# Patient Record
Sex: Female | Born: 1995 | Race: Black or African American | Marital: Single | Smoking: Never smoker
Health system: Southern US, Community
[De-identification: ages and names within clinical notes are randomized; demographics above are authoritative.]

## PROBLEM LIST (undated history)

## (undated) ENCOUNTER — Inpatient Hospital Stay (HOSPITAL_COMMUNITY): Payer: Self-pay

## (undated) DIAGNOSIS — F329 Major depressive disorder, single episode, unspecified: Secondary | ICD-10-CM

## (undated) DIAGNOSIS — Z789 Other specified health status: Secondary | ICD-10-CM

## (undated) DIAGNOSIS — F419 Anxiety disorder, unspecified: Secondary | ICD-10-CM

## (undated) DIAGNOSIS — F32A Depression, unspecified: Secondary | ICD-10-CM

## (undated) HISTORY — DX: Other specified health status: Z78.9

## (undated) HISTORY — PX: NO PAST SURGERIES: SHX2092

---
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
## 2017-11-17 LAB — OB RESULTS CONSOLE ABO/RH: RH Type: NEGATIVE

## 2017-11-17 LAB — OB RESULTS CONSOLE GC/CHLAMYDIA
Chlamydia: NEGATIVE
Gonorrhea: NEGATIVE

## 2017-11-17 LAB — OB RESULTS CONSOLE ANTIBODY SCREEN: ANTIBODY SCREEN: NEGATIVE

## 2017-11-17 LAB — OB RESULTS CONSOLE HIV ANTIBODY (ROUTINE TESTING): HIV: NONREACTIVE

## 2017-11-17 LAB — OB RESULTS CONSOLE RUBELLA ANTIBODY, IGM: Rubella: IMMUNE

## 2017-11-17 LAB — OB RESULTS CONSOLE RPR: RPR: NONREACTIVE

## 2017-11-17 LAB — OB RESULTS CONSOLE HEPATITIS B SURFACE ANTIGEN: Hepatitis B Surface Ag: NEGATIVE

## 2017-11-17 LAB — SICKLE CELL SCREEN: SICKLE CELL SCREEN: NEGATIVE

## 2018-05-23 ENCOUNTER — Encounter: Payer: Self-pay | Admitting: *Deleted

## 2018-05-23 DIAGNOSIS — Z3403 Encounter for supervision of normal first pregnancy, third trimester: Secondary | ICD-10-CM

## 2018-05-23 DIAGNOSIS — Z3400 Encounter for supervision of normal first pregnancy, unspecified trimester: Secondary | ICD-10-CM | POA: Insufficient documentation

## 2018-05-24 ENCOUNTER — Encounter: Payer: Self-pay | Admitting: Advanced Practice Midwife

## 2018-05-24 ENCOUNTER — Ambulatory Visit: Payer: Self-pay | Admitting: *Deleted

## 2018-06-02 ENCOUNTER — Encounter: Payer: Self-pay | Admitting: Advanced Practice Midwife

## 2018-06-02 ENCOUNTER — Encounter (INDEPENDENT_AMBULATORY_CARE_PROVIDER_SITE_OTHER): Payer: Self-pay

## 2018-06-02 ENCOUNTER — Ambulatory Visit: Payer: Medicaid Other | Admitting: *Deleted

## 2018-06-02 ENCOUNTER — Ambulatory Visit (INDEPENDENT_AMBULATORY_CARE_PROVIDER_SITE_OTHER): Payer: Medicaid Other | Admitting: Advanced Practice Midwife

## 2018-06-02 VITALS — BP 121/78 | HR 98 | Ht <= 58 in | Wt 168.0 lb

## 2018-06-02 DIAGNOSIS — O26893 Other specified pregnancy related conditions, third trimester: Secondary | ICD-10-CM

## 2018-06-02 DIAGNOSIS — Z1389 Encounter for screening for other disorder: Secondary | ICD-10-CM | POA: Diagnosis not present

## 2018-06-02 DIAGNOSIS — O360130 Maternal care for anti-D [Rh] antibodies, third trimester, not applicable or unspecified: Secondary | ICD-10-CM | POA: Diagnosis not present

## 2018-06-02 DIAGNOSIS — O26899 Other specified pregnancy related conditions, unspecified trimester: Secondary | ICD-10-CM

## 2018-06-02 DIAGNOSIS — Z3403 Encounter for supervision of normal first pregnancy, third trimester: Secondary | ICD-10-CM

## 2018-06-02 DIAGNOSIS — R87612 Low grade squamous intraepithelial lesion on cytologic smear of cervix (LGSIL): Secondary | ICD-10-CM | POA: Diagnosis not present

## 2018-06-02 DIAGNOSIS — Z331 Pregnant state, incidental: Secondary | ICD-10-CM | POA: Diagnosis not present

## 2018-06-02 DIAGNOSIS — Z6791 Unspecified blood type, Rh negative: Secondary | ICD-10-CM | POA: Insufficient documentation

## 2018-06-02 LAB — POCT URINALYSIS DIPSTICK OB
Blood, UA: NEGATIVE
Glucose, UA: NEGATIVE
KETONES UA: NEGATIVE
Leukocytes, UA: NEGATIVE
NITRITE UA: NEGATIVE
PROTEIN: NEGATIVE

## 2018-06-02 NOTE — Progress Notes (Signed)
LOW-RISK PREGNANCY VISIT Patient name: Paige Compton MRN [id number]  Date of birth: 1996/04/16 Chief Complaint:   Initial Prenatal Visit (transfer @ 36wk, headaches, blurred vision)  PNC at [facility], wants to deliver at [facility] available, uneventful pregnancy thus far.  Missed a 34 week appt d/t transfer, otherwise good care. 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 Will need pp colpo.   History of Present Illness:   Paige Compton is a 22 y.o. G1P0 female at [redacted]w[redacted]d with an Estimated Date of Delivery: 06/27/18 being seen today for ongoing management of a low-risk pregnancy.  Today she reports having episodes of HA and some blurred vision. .on Saturday.Went to hospital, no abnormalities found'  Contractions: Not present. Vag. Bleeding: None.  Movement: Present. denies leaking of fluid. Review of Systems:   Pertinent items are noted in HPI Denies abnormal vaginal discharge w/ itching/odor/irritation, headaches, visual changes, shortness of breath, chest pain, abdominal pain, severe nausea/vomiting, or problems with urination or bowel movements unless otherwise stated above.  Pertinent History Reviewed:  Medical & Surgical Hx:   Past Medical History:  Diagnosis Date  . Medical history non-contributory    History reviewed. No pertinent surgical history. Family History  Problem Relation Age of Onset  . Cancer Maternal Aunt        breast  . Leukemia Maternal Uncle   . Cancer Paternal Uncle        pancreatic  . Hypertension Maternal Grandmother     Current Outpatient Medications:  .  Prenat w/o A-FeCbGl-DSS-FA-DHA (CITRANATAL ASSURE) 35-1 & 300 MG tablet, Take 1 tablet by mouth daily., Disp: , Rfl:  Social History: Reviewed -  reports that she has never smoked. She has never used smokeless tobacco.  Physical Assessment:   Vitals:   06/02/18 1359 06/02/18 1400  BP: 121/78   Pulse: 98   Weight: 168 lb (76.2 kg)   Height:  4\' 9"  (1.448 m)  Body mass index is 36.35 kg/m.        Physical Examination:     General appearance: Well appearing, and in no distress  Mental status: Alert, oriented to person, place, and time  Skin: Warm & dry  Cardiovascular: Normal heart rate noted  Respiratory: Normal respiratory effort, no distress  Abdomen: Soft, gravid, nontender  Pelvic: Cervical exam deferred       vtx confirmed w/US  Extremities: Edema: Trace  Fetal Status:     Movement: Present    Results for orders placed or performed in visit on 06/02/18 (from the past 24 hour(s))  POC Urinalysis Dipstick OB   Collection Time: 06/02/18  2:22 PM  Result Value Ref Range   Color, UA     Clarity, UA     Glucose, UA Negative Negative   Bilirubin, UA     Ketones, UA neg    Spec Grav, UA     Blood, UA neg    pH, UA     POC,PROTEIN,UA Negative Negative, Trace, Small (1+), Moderate (2+), Large (3+), 4+   Urobilinogen, UA     Nitrite, UA neg    Leukocytes, UA Negative Negative   Appearance     Odor      Assessment & Plan:  1) Low-risk pregnancy G1P0 at [redacted]w[redacted]d with an Estimated Date of Delivery: 06/27/18   2) Transfer from [place],    Plan:  Continue routine obstetrical care    Follow-up: Return in about 1 week (around 06/09/2018) for [place].  Orders Placed This Encounter  Procedures  . POC Urinalysis  Dipstick OB   Christin Fudge CNM 06/02/2018 2:24 PM

## 2018-06-02 NOTE — Patient Instructions (Signed)
For Headaches:   Stay well hydrated, drink enough water so that your urine is clear, sometimes if you are dehydrated you can get headaches  Eat small frequent meals and snacks, sometimes if you are hungry you can get headaches  Sometimes you get headaches during pregnancy from the pregnancy hormones  You can try tylenol (1-2 regular strength 325mg  or 1-2 extra strength 500mg ) as directed on the box. The least amount of medication that works is best.   Cool compresses (cool wet washcloth or ice pack) to area of head that is hurting  You can also try drinking a caffeinated drink to see if this will help  If not helping, try below:  For Prevention of Headaches/Migraines:  CoQ10 100mg  three times daily  Vitamin B2 400mg  daily  Magnesium Oxide 400-600mg  daily  If You Get a Bad Headache/Migraine:  Benadryl 25mg    Magnesium Oxide  1 large Gatorade  2 extra strength Tylenol (1,000mg  total)  1 cup coffee or Coke  If this doesn't help please call us @ [phone]

## 2018-06-09 ENCOUNTER — Ambulatory Visit (INDEPENDENT_AMBULATORY_CARE_PROVIDER_SITE_OTHER): Payer: Medicaid Other | Admitting: Obstetrics and Gynecology

## 2018-06-09 VITALS — Wt 168.0 lb

## 2018-06-09 DIAGNOSIS — Z1389 Encounter for screening for other disorder: Secondary | ICD-10-CM | POA: Diagnosis not present

## 2018-06-09 DIAGNOSIS — Z3A38 38 weeks gestation of pregnancy: Secondary | ICD-10-CM

## 2018-06-09 DIAGNOSIS — Z331 Pregnant state, incidental: Secondary | ICD-10-CM | POA: Diagnosis not present

## 2018-06-09 DIAGNOSIS — Z3403 Encounter for supervision of normal first pregnancy, third trimester: Secondary | ICD-10-CM

## 2018-06-09 LAB — POCT URINALYSIS DIPSTICK OB
GLUCOSE, UA: NEGATIVE
Ketones, UA: NEGATIVE
LEUKOCYTES UA: NEGATIVE
Nitrite, UA: NEGATIVE
POC,PROTEIN,UA: NEGATIVE
RBC UA: NEGATIVE

## 2018-06-09 NOTE — Progress Notes (Signed)
Patient ID: Freida Nebel, female   DOB: 1995-09-06, 22 y.o.   MRN: [id number]    LOW-RISK PREGNANCY VISIT Patient name: Paige Compton MRN [id number]  Date of birth: August 31, 1995 Chief Complaint:   Routine Prenatal Visit  History of Present Illness:   Paige Compton is a 22 y.o. G1P0 female at [redacted]w[redacted]d with an Estimated Date of Delivery: 06/23/18 being seen today for ongoing management of a low-risk pregnancy. Long gap between being seen in pregnancy. Was going to a women's center every 2 week sthen missed appts since 27 wk till last visit here. Cousin is from [place], and pt decided to go to [employer].Transfer from [place] @ 36 weeks. At 06/02/18 head headache with blurry vision. Wants to deliver at [facility]. 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 Will need colpo. Lives in [place] but cousin will be her ride for delivery at [facility]. Has NOT been to [facility]. Today she reports no complaints. Contractions: Not present. Vag. Bleeding: None.  Movement: Present. denies leaking of fluid. Review of Systems:   Pertinent items are noted in HPI Denies abnormal vaginal discharge w/ itching/odor/irritation, headaches, visual changes, shortness of breath, chest pain, abdominal pain, severe nausea/vomiting, or problems with urination or bowel movements unless otherwise stated above. Pertinent History Reviewed:  Reviewed past medical,surgical, social, obstetrical and family history.  Reviewed problem list, medications and allergies. Physical Assessment:   Vitals:   06/09/18 1201  Weight: 168 lb (76.2 kg)  Body mass index is 36.35 kg/m.        Physical Examination:   General appearance: Well appearing, and in no distress  Mental status: Alert, oriented to person, place, and time  Skin: Warm & dry  Cardiovascular: Normal heart rate noted  Respiratory: Normal respiratory effort, no distress  Abdomen: Soft, gravid, nontender  Pelvic: Cervical exam performed closed, vertex        Extremities: Edema: Trace  Fetal Status: Fetal Heart Rate  (bpm): 137 Fundal Height: 36 cm Movement: Present    Results for orders placed or performed in visit on 06/09/18 (from the past 24 hour(s))  POC Urinalysis Dipstick OB   Collection Time: 06/09/18 12:04 PM  Result Value Ref Range   Color, UA     Clarity, UA     Glucose, UA Negative Negative   Bilirubin, UA     Ketones, UA neg    Spec Grav, UA     Blood, UA neg    pH, UA     POC,PROTEIN,UA Negative Negative, Trace, Small (1+), Moderate (2+), Large (3+), 4+   Urobilinogen, UA     Nitrite, UA neg    Leukocytes, UA Negative Negative   Appearance     Odor      Assessment & Plan:  1) Low-risk pregnancy G1P0 at [redacted]w[redacted]d with an Estimated Date of Delivery: 06/23/18   2) Headaches with blurry/strry vision, resolved  3) Constipation advised stool softener.   Meds: No orders of the defined types were placed in this encounter.  Labs/procedures today: GBS GC/CHL  Plan:  1. F/u in 1 week  Reviewed: Term labor symptoms and general obstetric precautions including but not limited to vaginal bleeding, contractions, leaking of fluid and fetal movement were reviewed in detail with the patient.  All questions were answered  Follow-up: No follow-ups on file.  Orders Placed This Encounter  Procedures  . GC/Chlamydia Probe Amp([facility])  . Culture, beta strep (group b only)  . POC Urinalysis Dipstick OB   By signing my name below, I, Samul Dada, attest that this  documentation has been prepared under the direction and in the presence of Jonnie Kind, MD. Electronically Signed: [place]. 06/09/18. 12:26 PM.  I personally performed the services described in this documentation, which was SCRIBED in my presence. The recorded information has been reviewed and considered accurate. It has been edited as necessary during review. Jonnie Kind, MD

## 2018-06-12 LAB — GC/CHLAMYDIA PROBE AMP
Chlamydia trachomatis, NAA: NEGATIVE
Neisseria gonorrhoeae by PCR: NEGATIVE

## 2018-06-13 LAB — CULTURE, BETA STREP (GROUP B ONLY): STREP GP B CULTURE: NEGATIVE

## 2018-06-16 ENCOUNTER — Inpatient Hospital Stay (HOSPITAL_COMMUNITY)
Admission: AD | Admit: 2018-06-16 | Discharge: 2018-06-16 | Disposition: A | Discharge status: Home / Self Care | Payer: Medicaid Other | Source: Ambulatory Visit | Attending: Obstetrics & Gynecology | Admitting: Obstetrics & Gynecology

## 2018-06-16 ENCOUNTER — Encounter (HOSPITAL_COMMUNITY): Payer: Self-pay | Admitting: *Deleted

## 2018-06-16 ENCOUNTER — Encounter: Payer: Medicaid Other | Admitting: Advanced Practice Midwife

## 2018-06-16 ENCOUNTER — Other Ambulatory Visit: Payer: Self-pay

## 2018-06-16 DIAGNOSIS — Z3A39 39 weeks gestation of pregnancy: Secondary | ICD-10-CM | POA: Insufficient documentation

## 2018-06-16 DIAGNOSIS — Z3689 Encounter for other specified antenatal screening: Secondary | ICD-10-CM | POA: Diagnosis not present

## 2018-06-16 DIAGNOSIS — O471 False labor at or after 37 completed weeks of gestation: Secondary | ICD-10-CM | POA: Insufficient documentation

## 2018-06-16 NOTE — MAU Note (Signed)
Pt presents to MAU with complaints of contractions that started a couple of hours ago. Denies any VB or LOF. +FM

## 2018-06-16 NOTE — MAU Provider Note (Signed)
None      S: Ms. Paige Compton is a 22 y.o. G1P0 at [redacted]w[redacted]d  who presents to MAU today complaining contractions q 5 minutes since approximately noon today. She denies vaginal bleeding. She denies LOF. She reports normal fetal movement.    O: BP 106/72   Pulse 97   Temp 97.6 F (36.4 C)   Resp 16   LMP 09/20/2017 (Approximate)   SpO2 100%  GENERAL: Well-developed, well-nourished female in no acute distress.  HEAD: Normocephalic, atraumatic.  CHEST: Normal effort of breathing, regular heart rate ABDOMEN: Soft, nontender, gravid  Cervical exam:  Dilation: Fingertip Effacement (%): 70 Station: -2 Presentation: Vertex Exam by:: F. Morris, RNC   Fetal Monitoring: Baseline: 130 Variability: moderate Accelerations: positive 15 x 15 Decelerations: none Contractions: irregular mild q 2-5   A: SIUP at [redacted]w[redacted]d  False labor  P: Discharge home in stable condition with labor precautions   Darlina Rumpf, CNM 06/16/2018 4:04 PM

## 2018-06-16 NOTE — Progress Notes (Signed)
I have communicated with Rosine Abe, CNM and reviewed vital signs:  Vitals:   06/16/18 1445 06/16/18 1611  BP: 106/72 118/80  Pulse: 97 (!) 105  Resp: 16 18  Temp: 97.6 F (36.4 C) 98.2 F (36.8 C)  SpO2: 100% 100%    Vaginal exam:  Dilation: Fingertip Effacement (%): 70 Station: -2 Presentation: Vertex Exam by:: F. Tiant [place], RNC,   Also reviewed contraction pattern and that non-stress test is reactive.  It has been documented that patient is contracting every 2-4 minutes with a  Cervical exam of a FT  not indicating active labor.  Patient denies any other complaints.  Based on this report provider has given order for discharge.  A discharge order and diagnosis entered by a provider.   Labor discharge instructions reviewed with patient.

## 2018-06-16 NOTE — Discharge Instructions (Signed)

## 2018-06-17 ENCOUNTER — Encounter: Payer: Medicaid Other | Admitting: Women's Health

## 2018-09-08 ENCOUNTER — Emergency Department (HOSPITAL_COMMUNITY)
Admission: EM | Admit: 2018-09-08 | Discharge: 2018-09-08 | Disposition: A | Discharge status: Home / Self Care | Payer: Medicaid Other | Attending: Emergency Medicine | Admitting: Emergency Medicine

## 2018-09-08 ENCOUNTER — Encounter (HOSPITAL_COMMUNITY): Payer: Self-pay | Admitting: *Deleted

## 2018-09-08 ENCOUNTER — Other Ambulatory Visit: Payer: Self-pay

## 2018-09-08 DIAGNOSIS — D242 Benign neoplasm of left breast: Secondary | ICD-10-CM | POA: Diagnosis not present

## 2018-09-08 DIAGNOSIS — Z79899 Other long term (current) drug therapy: Secondary | ICD-10-CM | POA: Insufficient documentation

## 2018-09-08 DIAGNOSIS — O9279 Other disorders of lactation: Secondary | ICD-10-CM

## 2018-09-08 DIAGNOSIS — D249 Benign neoplasm of unspecified breast: Secondary | ICD-10-CM

## 2018-09-08 NOTE — ED Triage Notes (Addendum)
Pt c/o left breast lump with pain that started about 1 week ago. Pt reports she breastfeeds and her child only feeds from the left breast. When she noticed this lump in her breast she showed it to her OB doctor and they said it should go away in a few days but it hasn't. Denies drainage.

## 2018-09-08 NOTE — Discharge Instructions (Addendum)
Apply warm compresses,  Massage area,  Continue to monitor area.  See your Physicain for recheck if area persist more than 2-3 weeks

## 2018-09-08 NOTE — ED Provider Notes (Signed)
[facility] EMERGENCY DEPARTMENT Provider Note   CSN: [id number] Arrival date & time: 09/08/18  1150    History   Chief Complaint Chief Complaint  Patient presents with  . Breast Problem    HPI Paige Compton is a 23 y.o. female.     Pt is breast feeding and she noticed a lump in her left upper mid breast 2 weeks ago.  Pt's ob advised her that area would go down but it has not.   The history is provided by the patient. No language interpreter was used.    Past Medical History:  Diagnosis Date  . Medical history non-contributory     Patient Active Problem List   Diagnosis Date Noted  . Rh negative state in antepartum period 06/02/2018  . LGSIL on Pap smear of cervix 06/02/2018  . Supervision of normal first pregnancy 05/23/2018    Past Surgical History:  Procedure Laterality Date  . NO PAST SURGERIES       OB History    Gravida  1   Para      Term      Preterm      AB      Living        SAB      TAB      Ectopic      Multiple      Live Births               Home Medications    Prior to Admission medications   Medication Sig Start Date End Date Taking? Authorizing Provider  Prenat w/o A-FeCbGl-DSS-FA-DHA (CITRANATAL ASSURE) 35-1 & 300 MG tablet Take 1 tablet by mouth daily.    [provider]    Family History Family History  Problem Relation Age of Onset  . Cancer Maternal Aunt        breast  . Leukemia Maternal Uncle   . Cancer Paternal Uncle        pancreatic  . Hypertension Maternal Grandmother   . Healthy Mother   . Healthy Father     Social History Social History   Tobacco Use  . Smoking status: Never Smoker  . Smokeless tobacco: Never Used  Substance Use Topics  . Alcohol use: Not Currently  . Drug use: Not Currently     Allergies   Patient has no known allergies.   Review of Systems Review of Systems  All other systems reviewed and are negative.    Physical Exam Updated Vital Signs BP  120/68   Pulse 68   Temp 98.1 F (36.7 C) (Oral)   Resp 18   Ht 4\' 9"  (1.448 m)   Wt 64.9 kg   LMP 09/20/2017 (Approximate)   SpO2 98%   Breastfeeding Unknown   BMI 30.94 kg/m   Physical Exam Vitals signs reviewed.  Constitutional:      Appearance: Normal appearance.  Neck:     Musculoskeletal: Normal range of motion.  Cardiovascular:     Rate and Rhythm: Normal rate.  Pulmonary:     Effort: Pulmonary effort is normal.  Abdominal:     General: Abdomen is flat.  Musculoskeletal: Normal range of motion.     Comments: 2cm swollen area left upper breast, area softens with palpation,    Skin:    General: Skin is warm.  Neurological:     General: No focal deficit present.     Mental Status: She is alert.  Psychiatric:  Mood and Affect: Mood normal.      ED Treatments / Results  Labs (all labs ordered are listed, but only abnormal results are displayed) Labs Reviewed - No data to display  EKG None  Radiology No results found.  Procedures Procedures (including critical care time)  Medications Ordered in ED Medications - No data to display   Initial Impression / Assessment and Plan / ED Course  I have reviewed the triage vital signs and the nursing notes.  Pertinent labs & imaging results that were available during my care of the patient were reviewed by me and considered in my medical decision making (see chart for details).        MDM  Pt advised to recheck with her MD in 2-3 weeks if area persist  Final Clinical Impressions(s) / ED Diagnoses   Final diagnoses:  Lactating adenoma of breast    ED Discharge Orders    None    An After Visit Summary was printed and given to the patient.    Fransico Meadow, [place] 09/08/18 1703    Milton Ferguson, MD 09/09/18 [phone]

## 2018-12-02 ENCOUNTER — Encounter (HOSPITAL_COMMUNITY): Payer: Self-pay

## 2018-12-02 ENCOUNTER — Emergency Department (HOSPITAL_COMMUNITY): Payer: Medicaid Other

## 2018-12-02 ENCOUNTER — Other Ambulatory Visit: Payer: Self-pay

## 2018-12-02 ENCOUNTER — Emergency Department (HOSPITAL_COMMUNITY)
Admission: EM | Admit: 2018-12-02 | Discharge: 2018-12-02 | Disposition: A | Discharge status: Home / Self Care | Payer: Medicaid Other | Attending: Emergency Medicine | Admitting: Emergency Medicine

## 2018-12-02 DIAGNOSIS — Y93I9 Activity, other involving external motion: Secondary | ICD-10-CM | POA: Diagnosis not present

## 2018-12-02 DIAGNOSIS — Y92410 Unspecified street and highway as the place of occurrence of the external cause: Secondary | ICD-10-CM | POA: Diagnosis not present

## 2018-12-02 DIAGNOSIS — Y998 Other external cause status: Secondary | ICD-10-CM | POA: Diagnosis not present

## 2018-12-02 DIAGNOSIS — S3992XA Unspecified injury of lower back, initial encounter: Secondary | ICD-10-CM | POA: Insufficient documentation

## 2018-12-02 DIAGNOSIS — M545 Low back pain, unspecified: Secondary | ICD-10-CM

## 2018-12-02 NOTE — ED Notes (Signed)
Pt has not voided  Has been given pos
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
## 2018-12-02 NOTE — ED Notes (Signed)
ED Provider at bedside. 

## 2018-12-02 NOTE — ED Triage Notes (Signed)
Pt reports back pain that began suddenly today in her lower back. Pt reports she was laying down when the pain started and she took some Tylenol apprx. 1300 with pain unrelieved.

## 2018-12-02 NOTE — ED Notes (Signed)
Called x1 to triage. No answer

## 2018-12-02 NOTE — ED Notes (Signed)
Pt observed leaving room and walking down hall   She has not returned

## 2018-12-10 ENCOUNTER — Emergency Department (HOSPITAL_COMMUNITY)
Admission: EM | Admit: 2018-12-10 | Discharge: 2018-12-10 | Disposition: A | Discharge status: Home / Self Care | Payer: Medicaid Other | Attending: Emergency Medicine | Admitting: Emergency Medicine

## 2018-12-10 ENCOUNTER — Other Ambulatory Visit: Payer: Self-pay

## 2018-12-10 ENCOUNTER — Encounter (HOSPITAL_COMMUNITY): Payer: Self-pay | Admitting: Emergency Medicine

## 2018-12-10 DIAGNOSIS — R51 Headache: Secondary | ICD-10-CM | POA: Diagnosis present

## 2018-12-10 DIAGNOSIS — G43909 Migraine, unspecified, not intractable, without status migrainosus: Secondary | ICD-10-CM | POA: Insufficient documentation

## 2018-12-10 LAB — URINALYSIS, ROUTINE W REFLEX MICROSCOPIC
Bilirubin Urine: NEGATIVE
Glucose, UA: NEGATIVE mg/dL
Hgb urine dipstick: NEGATIVE
Ketones, ur: NEGATIVE mg/dL
Leukocytes,Ua: NEGATIVE
Nitrite: NEGATIVE
Protein, ur: NEGATIVE mg/dL
Specific Gravity, Urine: 1.025 (ref 1.005–1.030)
pH: 7 (ref 5.0–8.0)

## 2018-12-10 LAB — PREGNANCY, URINE: Preg Test, Ur: NEGATIVE

## 2018-12-10 MED ORDER — KETOROLAC TROMETHAMINE 60 MG/2ML IM SOLN
60.0000 mg | Freq: Once | INTRAMUSCULAR | Status: AC
  Administered 2018-12-10: 60 mg via INTRAMUSCULAR
  Filled 2018-12-10: qty 2

## 2018-12-10 MED ORDER — DIPHENHYDRAMINE HCL 25 MG PO CAPS
25.0000 mg | ORAL_CAPSULE | Freq: Once | ORAL | Status: AC
  Administered 2018-12-10: 25 mg via ORAL
  Filled 2018-12-10: qty 1

## 2018-12-10 MED ORDER — METOCLOPRAMIDE HCL 5 MG/ML IJ SOLN
10.0000 mg | Freq: Once | INTRAMUSCULAR | Status: AC
  Administered 2018-12-10: 10 mg via INTRAMUSCULAR
  Filled 2018-12-10: qty 2

## 2018-12-10 NOTE — ED Triage Notes (Signed)
Pt c/o HA with blurred vision and light sensitivity that began this morning when she woke up. Reports hx of migraines. Denies any other neurological symptoms.

## 2018-12-10 NOTE — Discharge Instructions (Signed)
Follow-up with your doctor if needed °

## 2018-12-10 NOTE — ED Provider Notes (Signed)
[facility] EMERGENCY DEPARTMENT Provider Note   CSN: [id number] Arrival date & time: 12/10/18  1056     History   Chief Complaint Chief Complaint  Patient presents with  . Headache    HPI Paige Compton is a 23 y.o. female.     HPI   Paige Compton is a 23 y.o. female who presents to the Emergency Department complaining of left-sided headache, nausea, photophobia, and dizziness with movement.  Symptoms began upon waking this morning.  She reports a history of migraine headaches and states her current pain feels similar to previous.  She states that she has similar headache with her pregnancy.  She denies vomiting, neck pain or stiffness, blurred vision, numbness or weakness.  She is not taken any over-the-counter medications for symptomatic relief.   Past Medical History:  Diagnosis Date  . Medical history non-contributory     Patient Active Problem List   Diagnosis Date Noted  . Rh negative state in antepartum period 06/02/2018  . LGSIL on Pap smear of cervix 06/02/2018  . Supervision of normal first pregnancy 05/23/2018    Past Surgical History:  Procedure Laterality Date  . NO PAST SURGERIES       OB History    Gravida  1   Para      Term      Preterm      AB      Living  1     SAB      TAB      Ectopic      Multiple      Live Births               Home Medications    Prior to Admission medications   Medication Sig Start Date End Date Taking? Authorizing Provider  levonorgestrel-ethinyl estradiol (ALESSE) 0.1-20 MG-MCG tablet Take 1 tablet by mouth daily. 11/17/18   [provider]    Family History Family History  Problem Relation Age of Onset  . Cancer Maternal Aunt        breast  . Leukemia Maternal Uncle   . Cancer Paternal Uncle        pancreatic  . Hypertension Maternal Grandmother   . Healthy Mother   . Healthy Father     Social History Social History   Tobacco Use  . Smoking status: Never Smoker  .  Smokeless tobacco: Never Used  Substance Use Topics  . Alcohol use: Not Currently  . Drug use: Not Currently     Allergies   Latex   Review of Systems Review of Systems  Constitutional: Negative for activity change, appetite change and fever.  HENT: Negative for facial swelling and trouble swallowing.   Eyes: Positive for photophobia. Negative for pain and visual disturbance.  Respiratory: Negative for chest tightness and shortness of breath.   Cardiovascular: Negative for chest pain.  Gastrointestinal: Positive for nausea. Negative for vomiting.  Genitourinary: Negative for difficulty urinating, dysuria and frequency.  Musculoskeletal: Negative for back pain, neck pain and neck stiffness.  Skin: Negative for rash and wound.  Neurological: Positive for dizziness and headaches. Negative for syncope, facial asymmetry, speech difficulty, weakness and numbness.  Psychiatric/Behavioral: Negative for confusion and decreased concentration.     Physical Exam Updated Vital Signs BP 118/84 (BP Location: Left Arm)   Pulse 100   Temp 97.9 F (36.6 C) (Oral)   Resp 16   Ht 4\' 9"  (1.448 m)   Wt 65.8 kg   LMP 11/21/2018 (  Exact Date)   SpO2 99%   BMI 31.38 kg/m   Physical Exam Vitals signs and nursing note reviewed.  Constitutional:      General: She is not in acute distress.    Appearance: She is well-developed.  HENT:     Head: Normocephalic and atraumatic.  Eyes:     Conjunctiva/sclera: Conjunctivae normal.     Pupils: Pupils are equal, round, and reactive to light.  Neck:     Musculoskeletal: Normal range of motion and neck supple. No neck rigidity, spinous process tenderness or muscular tenderness.     Trachea: Phonation normal.     Meningeal: Kernig's sign absent.  Cardiovascular:     Rate and Rhythm: Normal rate and regular rhythm.  Pulmonary:     Effort: Pulmonary effort is normal. No respiratory distress.     Breath sounds: Normal breath sounds.  Abdominal:      Palpations: Abdomen is soft.     Tenderness: There is no abdominal tenderness.  Musculoskeletal: Normal range of motion.  Skin:    General: Skin is warm and dry.     Capillary Refill: Capillary refill takes less than 2 seconds.     Findings: No rash.  Neurological:     Mental Status: She is alert and oriented to person, place, and time.     GCS: GCS eye subscore is 4. GCS verbal subscore is 5. GCS motor subscore is 6.     Cranial Nerves: No cranial nerve deficit.     Sensory: Sensation is intact. No sensory deficit.     Motor: Motor function is intact. No abnormal muscle tone.     Coordination: Coordination is intact. Coordination normal.     Gait: Gait normal.     Deep Tendon Reflexes:     Reflex Scores:      Tricep reflexes are 2+ on the right side and 2+ on the left side.      Bicep reflexes are 2+ on the right side and 2+ on the left side.    Comments: CN III-XII grossly intact.  Normal finger-to-nose and heel shin testing.  Grip strengths are strong and symmetrical bilaterally.  Psychiatric:        Thought Content: Thought content normal.      ED Treatments / Results  Labs (all labs ordered are listed, but only abnormal results are displayed) Labs Reviewed - No data to display  EKG    Radiology No results found.  Procedures Procedures (including critical care time)  Medications Ordered in ED Medications  ketorolac (TORADOL) injection 60 mg (has no administration in time range)  metoCLOPramide (REGLAN) injection 10 mg (has no administration in time range)  diphenhydrAMINE (BENADRYL) capsule 25 mg (has no administration in time range)     Initial Impression / Assessment and Plan / ED Course  I have reviewed the triage vital signs and the nursing notes.  Pertinent labs & imaging results that were available during my care of the patient were reviewed by me and considered in my medical decision making (see chart for details).    Orthostatic Lying  BP- Lying:  113/75 Pulse- Lying: 77 Orthostatic Sitting BP- Sitting: 110/78 Pulse- Sitting: 86 Orthostatic Standing at 0 minutes BP- Standing at 0 minutes: 114/82 Pulse- Standing at 0 minutes: 97    Patient well-appearing.  Left-sided headache with photophobia, nausea, and dizziness, dizziness is associated with movement only.  No nuchal rigidity or focal neuro deficit.   1250  On recheck, pt reports headache  pain has improved and she is requesting d/c home.  She has ambulated in the dept with a steady gait.  No concerning sx's for emergent process, likely migraine headache.  She agrees to close out pt f/u and return precautions provided.     Final Clinical Impressions(s) / ED Diagnoses   Final diagnoses:  Migraine without status migrainosus, not intractable, unspecified migraine type    ED Discharge Orders    None       Kem Parkinson, PA-C 12/10/18 1253    Milton Ferguson, MD 12/10/18 1711

## 2018-12-10 NOTE — ED Notes (Signed)
Pt states she is feeling much better. Would like to know why she always gets a headache when she cooks.

## 2019-03-14 ENCOUNTER — Other Ambulatory Visit: Payer: Self-pay

## 2019-03-14 ENCOUNTER — Emergency Department (HOSPITAL_COMMUNITY)
Admission: EM | Admit: 2019-03-14 | Discharge: 2019-03-14 | Disposition: A | Discharge status: Home / Self Care | Payer: Medicaid Other | Attending: Emergency Medicine | Admitting: Emergency Medicine

## 2019-03-14 ENCOUNTER — Encounter (HOSPITAL_COMMUNITY): Payer: Self-pay | Admitting: Emergency Medicine

## 2019-03-14 DIAGNOSIS — M79621 Pain in right upper arm: Secondary | ICD-10-CM | POA: Diagnosis not present

## 2019-03-14 DIAGNOSIS — Z91040 Latex allergy status: Secondary | ICD-10-CM | POA: Diagnosis not present

## 2019-03-14 DIAGNOSIS — Z79899 Other long term (current) drug therapy: Secondary | ICD-10-CM | POA: Diagnosis not present

## 2019-03-14 DIAGNOSIS — R590 Localized enlarged lymph nodes: Secondary | ICD-10-CM | POA: Diagnosis not present

## 2019-03-14 DIAGNOSIS — N644 Mastodynia: Secondary | ICD-10-CM

## 2019-03-14 MED ORDER — NAPROXEN 500 MG PO TABS: 500.0000 mg | ORAL_TABLET | Freq: Two times a day (BID) | ORAL | 0 refills | Status: DC

## 2019-03-14 MED ORDER — NAPROXEN 250 MG PO TABS
500.0000 mg | ORAL_TABLET | Freq: Once | ORAL | Status: AC
  Administered 2019-03-14: 02:00:00 500 mg via ORAL
  Filled 2019-03-14: qty 2

## 2019-03-14 NOTE — ED Provider Notes (Signed)
[facility] EMERGENCY DEPARTMENT Provider Note   CSN: [id number] Arrival date & time: 03/14/19  0041     History   Chief Complaint Chief Complaint  Patient presents with  . Breast Pain    HPI Paige Compton is a 23 y.o. female.     HPI  This is a 23 year old female who presents with right breast and axillary pain.  Patient reports 1 to 2-day history of right sided breast and axillary pain.  She states the pain starts in axilla and extends into her right breast.  She has not noted any overlying skin changes or nipple discharge.  She is 8 months postpartum and stopped breast-feeding 2 months ago.  She states she primarily breast-fed from her left breast.  She has no significant history of mastitis.  She denies any fevers.  She has not taken anything for her pain.  Currently she rates her pain at 8 out of 10.  Past Medical History:  Diagnosis Date  . Medical history non-contributory     Patient Active Problem List   Diagnosis Date Noted  . Rh negative state in antepartum period 06/02/2018  . LGSIL on Pap smear of cervix 06/02/2018  . Supervision of normal first pregnancy 05/23/2018    Past Surgical History:  Procedure Laterality Date  . NO PAST SURGERIES       OB History    Gravida  1   Para      Term      Preterm      AB      Living  1     SAB      TAB      Ectopic      Multiple      Live Births               Home Medications    Prior to Admission medications   Medication Sig Start Date End Date Taking? Authorizing Provider  levonorgestrel-ethinyl estradiol (ALESSE) 0.1-20 MG-MCG tablet Take 1 tablet by mouth daily. 11/17/18   [provider]  naproxen (NAPROSYN) 500 MG tablet Take 1 tablet (500 mg total) by mouth 2 (two) times daily. 03/14/19   Teodoro Jeffreys, Barbette Hair, MD    Family History Family History  Problem Relation Age of Onset  . Cancer Maternal Aunt        breast  . Leukemia Maternal Uncle   . Cancer Paternal Uncle    pancreatic  . Hypertension Maternal Grandmother   . Healthy Mother   . Healthy Father     Social History Social History   Tobacco Use  . Smoking status: Never Smoker  . Smokeless tobacco: Never Used  Substance Use Topics  . Alcohol use: Not Currently  . Drug use: Not Currently     Allergies   Latex   Review of Systems Review of Systems  Constitutional: Negative for fever.  Respiratory: Negative for shortness of breath.   Cardiovascular: Negative for chest pain.  Gastrointestinal: Negative for nausea and vomiting.  Musculoskeletal:       Axillary pain  Skin: Negative for color change.  All other systems reviewed and are negative.    Physical Exam Updated Vital Signs BP (!) 121/95   Pulse 91   Temp 97.7 F (36.5 C) (Oral)   Resp 16   Wt 70.3 kg   LMP 03/14/2019   SpO2 99%   BMI 33.54 kg/m   Physical Exam Vitals signs and nursing note reviewed. Exam conducted with a chaperone  present.  Constitutional:      Appearance: She is well-developed. She is not ill-appearing.  HENT:     Head: Normocephalic and atraumatic.  Neck:     Musculoskeletal: Neck supple.  Cardiovascular:     Rate and Rhythm: Normal rate and regular rhythm.  Pulmonary:     Effort: Pulmonary effort is normal. No respiratory distress.  Chest:     Breasts:        Right: Normal. No bleeding, inverted nipple, mass, nipple discharge, skin change or tenderness.        Left: Normal. No bleeding, inverted nipple, mass, nipple discharge, skin change or tenderness.  Abdominal:     Palpations: Abdomen is soft.     Tenderness: There is no abdominal tenderness.  Lymphadenopathy:     Upper Body:     Right upper body: Axillary adenopathy present.     Left upper body: No axillary adenopathy.  Skin:    General: Skin is warm and dry.  Neurological:     Mental Status: She is alert and oriented to person, place, and time.  Psychiatric:        Mood and Affect: Mood normal.      ED Treatments /  Results  Labs (all labs ordered are listed, but only abnormal results are displayed) Labs Reviewed - No data to display  EKG None  Radiology No results found.  Procedures Procedures (including critical care time)  Medications Ordered in ED Medications  naproxen (NAPROSYN) tablet 500 mg (500 mg Oral Given 03/14/19 0132)     Initial Impression / Assessment and Plan / ED Course  I have reviewed the triage vital signs and the nursing notes.  Pertinent labs & imaging results that were available during my care of the patient were reviewed by me and considered in my medical decision making (see chart for details).        Patient presents with right axillary and breast pain.  She is overall nontoxic and vital signs are reassuring.  Her exam is largely benign.  She may have slightly increased adenopathy in the right axilla.  There is no evidence of infection or abscess.  No overlying skin changes or fluctuance.  I do not feel any breast masses.  She was recently breast-feeding.  I have recommended that she use ice and anti-inflammatories.  Monitor closely for worsening symptoms, overlying skin changes or fluctuance.  Follow-up with OB/GYN.  After history, exam, and medical workup I feel the patient has been appropriately medically screened and is safe for discharge home. Pertinent diagnoses were discussed with the patient. Patient was given return precautions.   Final Clinical Impressions(s) / ED Diagnoses   Final diagnoses:  Breast pain    ED Discharge Orders         Ordered    naproxen (NAPROSYN) 500 MG tablet  2 times daily     03/14/19 0146           Chitara Clonch, Barbette Hair, MD 03/14/19 [phone]

## 2019-03-14 NOTE — Discharge Instructions (Signed)
You were seen today for right axillary and breast tenderness.  Your exam is largely unremarkable.  Apply ice and take naproxen as needed for pain.  Follow-up with your OB/GYN for recheck.  If you note skin changes, an area of redness or a hard lump you need to be reevaluated.

## 2019-03-14 NOTE — ED Triage Notes (Signed)
Pt C/O pain in her right breast that radiates to the right axilla.

## 2019-03-20 ENCOUNTER — Encounter (HOSPITAL_COMMUNITY): Payer: Self-pay

## 2019-03-20 ENCOUNTER — Other Ambulatory Visit: Payer: Self-pay

## 2019-03-20 ENCOUNTER — Emergency Department (HOSPITAL_COMMUNITY)
Admission: EM | Admit: 2019-03-20 | Discharge: 2019-03-20 | Disposition: A | Discharge status: Home / Self Care | Payer: Medicaid Other | Attending: Emergency Medicine | Admitting: Emergency Medicine

## 2019-03-20 DIAGNOSIS — Z5321 Procedure and treatment not carried out due to patient leaving prior to being seen by health care provider: Secondary | ICD-10-CM | POA: Diagnosis not present

## 2019-03-20 DIAGNOSIS — M79601 Pain in right arm: Secondary | ICD-10-CM | POA: Diagnosis not present

## 2019-03-20 NOTE — ED Notes (Signed)
Pt not in waiting room

## 2019-03-20 NOTE — ED Triage Notes (Signed)
Pt c/o right arm pain that radiates to right breast x 1 week.

## 2019-03-21 NOTE — ED Provider Notes (Signed)
I did not see or evaluate patient. It appears she left without being seen by a provider.    Rawlins Stuard, Corene Cornea, MD 03/21/19 [phone]

## 2019-08-30 ENCOUNTER — Emergency Department (HOSPITAL_COMMUNITY)
Admission: EM | Admit: 2019-08-30 | Discharge: 2019-08-31 | Disposition: A | Discharge status: Other Acute Inpatient Hospital | Payer: Medicaid Other | Attending: Emergency Medicine | Admitting: Emergency Medicine

## 2019-08-30 ENCOUNTER — Other Ambulatory Visit: Payer: Self-pay

## 2019-08-30 ENCOUNTER — Encounter (HOSPITAL_COMMUNITY): Payer: Self-pay

## 2019-08-30 DIAGNOSIS — Z20822 Contact with and (suspected) exposure to covid-19: Secondary | ICD-10-CM | POA: Diagnosis not present

## 2019-08-30 DIAGNOSIS — R45851 Suicidal ideations: Secondary | ICD-10-CM | POA: Diagnosis not present

## 2019-08-30 DIAGNOSIS — F332 Major depressive disorder, recurrent severe without psychotic features: Secondary | ICD-10-CM | POA: Insufficient documentation

## 2019-08-30 DIAGNOSIS — Z91040 Latex allergy status: Secondary | ICD-10-CM | POA: Diagnosis not present

## 2019-08-30 DIAGNOSIS — F329 Major depressive disorder, single episode, unspecified: Secondary | ICD-10-CM | POA: Diagnosis present

## 2019-08-30 DIAGNOSIS — Z79899 Other long term (current) drug therapy: Secondary | ICD-10-CM | POA: Diagnosis not present

## 2019-08-30 LAB — CBC
HCT: 42.9 % (ref 36.0–46.0)
Hemoglobin: 13.3 g/dL (ref 12.0–15.0)
MCH: 22.5 pg — ABNORMAL LOW (ref 26.0–34.0)
MCHC: 31 g/dL (ref 30.0–36.0)
MCV: 72.5 fL — ABNORMAL LOW (ref 80.0–100.0)
Platelets: 379 10*3/uL (ref 150–400)
RBC: 5.92 MIL/uL — ABNORMAL HIGH (ref 3.87–5.11)
RDW: 15.9 % — ABNORMAL HIGH (ref 11.5–15.5)
WBC: 8.5 10*3/uL (ref 4.0–10.5)
nRBC: 0 % (ref 0.0–0.2)

## 2019-08-30 LAB — ETHANOL: Alcohol, Ethyl (B): <10 mg/dL (ref <10)

## 2019-08-30 LAB — COMPREHENSIVE METABOLIC PANEL
ALT: 21 U/L (ref 0–44)
AST: 24 U/L (ref 15–41)
Albumin: 3.7 g/dL (ref 3.5–5.0)
Alkaline Phosphatase: 106 U/L (ref 38–126)
Anion gap: 5 (ref 5–15)
BUN: 11 mg/dL (ref 6–20)
CO2: 24 mmol/L (ref 22–32)
Calcium: 8.8 mg/dL — ABNORMAL LOW (ref 8.9–10.3)
Chloride: 108 mmol/L (ref 98–111)
Creatinine, Ser: 0.88 mg/dL (ref 0.44–1.00)
GFR calc Af Amer: >60 mL/min (ref >60)
GFR calc non Af Amer: >60 mL/min (ref >60)
Glucose, Bld: 114 mg/dL — ABNORMAL HIGH (ref 70–99)
Potassium: 3.9 mmol/L (ref 3.5–5.1)
Sodium: 137 mmol/L (ref 135–145)
Total Bilirubin: 0.5 mg/dL (ref 0.3–1.2)
Total Protein: 7.5 g/dL (ref 6.5–8.1)

## 2019-08-30 LAB — RESPIRATORY PANEL BY RT PCR (FLU A&B, COVID)
Influenza A by PCR: NEGATIVE
Influenza B by PCR: NEGATIVE
SARS Coronavirus 2 by RT PCR: NEGATIVE

## 2019-08-30 LAB — ACETAMINOPHEN LEVEL: Acetaminophen (Tylenol), Serum: <10 ug/mL — ABNORMAL LOW (ref 10–30)

## 2019-08-30 LAB — SALICYLATE LEVEL: Salicylate Lvl: <7 mg/dL — ABNORMAL LOW (ref 7.0–30.0)

## 2019-08-30 MED ORDER — ACETAMINOPHEN 325 MG PO TABS: 650.0000 mg | ORAL_TABLET | ORAL | Status: DC | PRN

## 2019-08-30 NOTE — [facility] Counselor (Signed)
Pt consented for clinician to call her mother to gather collateral information. Pt did not know mother's phone number, clinician got number from chart, pt told clinician to try the number. Clinician called pt's mother Betzaida Ahlstrand, [phone]) and left a HIPPA compliant voice message.    Vertell Novak, [place], [facility], [facility] Triage Specialist [phone]
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
## 2019-08-30 NOTE — ED Notes (Signed)
Multiple attempts to call mother on pts disposition, unable to get in touch with her at this time.

## 2019-08-30 NOTE — Progress Notes (Signed)
Pt accepted to [facility] 301-01.      Talbot Grumbling, NP is the accepting provider.    Dr. Mallie Darting  is the attending provider.    Call report to [phone].     Kennith Maes, PA @ [facility] ED notified.     Pt is Voluntary.    Pt may be transported by [facility].   Pt scheduled to arrive on 08/31/19 after 8:00am. Pending negative COVID screen.     Domenic Schwab, MSW, LCSW-A Clinical Disposition Social Worker [facility]/TTS [phone]

## 2019-08-30 NOTE — Progress Notes (Addendum)
Pt accepted to inpatient at [facility].  May come Thursday 08/31/19 after 0800 hours, pending negative COVID test results.  Will be admitted to service of MD [facility].  Room 301-01

## 2019-08-30 NOTE — ED Notes (Signed)
Pt back from tts, given meal tray

## 2019-08-30 NOTE — ED Provider Notes (Signed)
[facility] EMERGENCY DEPARTMENT Provider Note   CSN: [id number] Arrival date & time: 08/30/19  1756     History Chief Complaint  Patient presents with  . V70.1    Paige Compton is a 24 y.o. female without significant past medical history who presents to the emergency department with complaints of depression and suicidal thoughts over the past 1 month.  Patient states she has felt generally depressed, it seems to be getting progressively worse, she has thoughts of suicide without specific plan.  She states she is had a lot of stress at work that is contributing to this.  No other specific alleviating or aggravating factors.  She has not attempted to harm herself.  She denies homicidal ideations or hallucinations.  She denies drug or alcohol use.  HPI     Past Medical History:  Diagnosis Date  . Medical history non-contributory     Patient Active Problem List   Diagnosis Date Noted  . Rh negative state in antepartum period 06/02/2018  . LGSIL on Pap smear of cervix 06/02/2018  . Supervision of normal first pregnancy 05/23/2018    Past Surgical History:  Procedure Laterality Date  . NO PAST SURGERIES       OB History    Gravida  1   Para      Term      Preterm      AB      Living  1     SAB      TAB      Ectopic      Multiple      Live Births              Family History  Problem Relation Age of Onset  . Cancer Maternal Aunt        breast  . Leukemia Maternal Uncle   . Cancer Paternal Uncle        pancreatic  . Hypertension Maternal Grandmother   . Healthy Mother   . Healthy Father     Social History   Tobacco Use  . Smoking status: Never Smoker  . Smokeless tobacco: Never Used  Substance Use Topics  . Alcohol use: Not Currently  . Drug use: Not Currently    Home Medications Prior to Admission medications   Medication Sig Start Date End Date Taking? Authorizing Provider  levonorgestrel-ethinyl estradiol (ALESSE) 0.1-20 MG-MCG  tablet Take 1 tablet by mouth daily. 11/17/18   [provider]  naproxen (NAPROSYN) 500 MG tablet Take 1 tablet (500 mg total) by mouth 2 (two) times daily. 03/14/19   Horton, Barbette Hair, MD    Allergies    Latex  Review of Systems   Review of Systems  Constitutional: Negative for chills and fever.  Respiratory: Negative for shortness of breath.   Cardiovascular: Negative for chest pain.  Gastrointestinal: Negative for abdominal pain, nausea and vomiting.  Neurological: Negative for syncope.  Psychiatric/Behavioral: Positive for suicidal ideas. Negative for hallucinations.  All other systems reviewed and are negative.   Physical Exam Updated Vital Signs BP 118/76 (BP Location: Right Arm)   Pulse 92   Temp 97.9 F (36.6 C) (Oral)   Resp 20   Ht 4' 9.5" (1.461 m)   Wt 71.7 kg   LMP 08/12/2019   SpO2 100%   BMI 33.60 kg/m   Physical Exam Vitals and nursing note reviewed.  Constitutional:      General: She is not in acute distress.    Appearance: She  is well-developed. She is not toxic-appearing.  HENT:     Head: Normocephalic and atraumatic.  Eyes:     General:        Right eye: No discharge.        Left eye: No discharge.     Conjunctiva/sclera: Conjunctivae normal.  Cardiovascular:     Rate and Rhythm: Normal rate and regular rhythm.  Pulmonary:     Effort: Pulmonary effort is normal. No respiratory distress.     Breath sounds: Normal breath sounds. No wheezing, rhonchi or rales.  Abdominal:     General: There is no distension.     Palpations: Abdomen is soft.     Tenderness: There is no abdominal tenderness.  Musculoskeletal:     Cervical back: Neck supple.  Skin:    General: Skin is warm and dry.     Findings: No rash.  Neurological:     Mental Status: She is alert.     Comments: Clear speech.   Psychiatric:        Attention and Perception: She does not perceive auditory or visual hallucinations.        Behavior: Behavior normal.        Thought  Content: Thought content includes suicidal ideation. Thought content does not include homicidal ideation.     ED Results / Procedures / Treatments   Labs (all labs ordered are listed, but only abnormal results are displayed) Labs Reviewed  COMPREHENSIVE METABOLIC PANEL - Abnormal; Notable for the following components:      Result Value   Glucose, Bld 114 (*)    Calcium 8.8 (*)    All other components within normal limits  SALICYLATE LEVEL - Abnormal; Notable for the following components:   Salicylate Lvl [id number] (*)    All other components within normal limits  ACETAMINOPHEN LEVEL - Abnormal; Notable for the following components:   Acetaminophen (Tylenol), Serum <10 (*)    All other components within normal limits  CBC - Abnormal; Notable for the following components:   RBC 5.92 (*)    MCV 72.5 (*)    MCH 22.5 (*)    RDW 15.9 (*)    All other components within normal limits  RESPIRATORY PANEL BY RT PCR (FLU A&B, COVID)  ETHANOL  RAPID URINE DRUG SCREEN, HOSP PERFORMED  PREGNANCY, URINE    EKG None  Radiology No results found.  Procedures Procedures (including critical care time)  Medications Ordered in ED Medications - No data to display  ED Course  I have reviewed the triage vital signs and the nursing notes.  Pertinent labs & imaging results that were available during my care of the patient were reviewed by me and considered in my medical decision making (see chart for details).    Easton Gales was evaluated in Emergency Department on 08/30/2019 for the symptoms described in the history of present illness. He/she was evaluated in the context of the global COVID-19 pandemic, which necessitated consideration that the patient might be at risk for infection with the SARS-CoV-2 virus that causes COVID-19. Institutional protocols and algorithms that pertain to the evaluation of patients at risk for COVID-19 are in a state of rapid change based on information released by  regulatory bodies including the CDC and federal and state organizations. These policies and algorithms were followed during the patient's care in the ED.  MDM Rules/Calculators/A&P  Patient presents to the emergency department with complaints of depression and suicidal ideations.  She is nontoxic, resting comfortably, vitals WNL.  Screening blood work is fairly unremarkable.  Patient is medically cleared, disposition per [facility].   Discussed with Grady- patient is recommended for inpatient treatment, has bed @ [facility] tomorrow after 8AM. (301-01, Dr. Mallie Darting accepting)   Final Clinical Impression(s) / ED Diagnoses Final diagnoses:  Suicidal ideation    Rx / DC Orders ED Discharge Orders    None       Amaryllis Dyke, PA-C 08/30/19 2316    Lucrezia Starch, MD 08/31/19 1537
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
## 2019-08-31 ENCOUNTER — Other Ambulatory Visit: Payer: Self-pay

## 2019-08-31 ENCOUNTER — Encounter (HOSPITAL_COMMUNITY): Payer: Self-pay | Admitting: Psychiatry

## 2019-08-31 ENCOUNTER — Inpatient Hospital Stay (HOSPITAL_COMMUNITY)
Admission: AD | Admit: 2019-08-31 | Discharge: 2019-09-03 | DRG: 885 | Disposition: A | Discharge status: Home / Self Care | Payer: Medicaid Other | Source: Intra-hospital | Attending: Psychiatry | Admitting: Psychiatry

## 2019-08-31 DIAGNOSIS — F329 Major depressive disorder, single episode, unspecified: Secondary | ICD-10-CM | POA: Diagnosis present

## 2019-08-31 DIAGNOSIS — F339 Major depressive disorder, recurrent, unspecified: Principal | ICD-10-CM | POA: Diagnosis present

## 2019-08-31 DIAGNOSIS — F411 Generalized anxiety disorder: Secondary | ICD-10-CM | POA: Diagnosis present

## 2019-08-31 DIAGNOSIS — Z915 Personal history of self-harm: Secondary | ICD-10-CM

## 2019-08-31 DIAGNOSIS — Z818 Family history of other mental and behavioral disorders: Secondary | ICD-10-CM | POA: Diagnosis not present

## 2019-08-31 DIAGNOSIS — Z20822 Contact with and (suspected) exposure to covid-19: Secondary | ICD-10-CM | POA: Diagnosis present

## 2019-08-31 DIAGNOSIS — F32A Depression, unspecified: Secondary | ICD-10-CM | POA: Diagnosis present

## 2019-08-31 DIAGNOSIS — R45851 Suicidal ideations: Secondary | ICD-10-CM | POA: Diagnosis present

## 2019-08-31 DIAGNOSIS — R41843 Psychomotor deficit: Secondary | ICD-10-CM | POA: Diagnosis present

## 2019-08-31 DIAGNOSIS — F332 Major depressive disorder, recurrent severe without psychotic features: Secondary | ICD-10-CM | POA: Diagnosis not present

## 2019-08-31 HISTORY — DX: Anxiety disorder, unspecified: F41.9

## 2019-08-31 HISTORY — DX: Depression, unspecified: F32.A

## 2019-08-31 LAB — RAPID URINE DRUG SCREEN, HOSP PERFORMED
Amphetamines: NOT DETECTED
Barbiturates: NOT DETECTED
Benzodiazepines: NOT DETECTED
Cocaine: NOT DETECTED
Opiates: NOT DETECTED
Tetrahydrocannabinol: NOT DETECTED

## 2019-08-31 LAB — HCG, QUANTITATIVE, PREGNANCY: hCG, Beta Chain, Quant, S: <1 m[IU]/mL (ref <5)

## 2019-08-31 LAB — TSH: TSH: 1.512 u[IU]/mL (ref 0.350–4.500)

## 2019-08-31 MED ORDER — ALUM & MAG HYDROXIDE-SIMETH 200-200-20 MG/5ML PO SUSP: 30.0000 mL | ORAL | Status: DC | PRN

## 2019-08-31 MED ORDER — MAGNESIUM HYDROXIDE 400 MG/5ML PO SUSP: 30.0000 mL | Freq: Every day | ORAL | Status: DC | PRN

## 2019-08-31 MED ORDER — THIAMINE HCL 100 MG PO TABS
100.0000 mg | ORAL_TABLET | Freq: Every day | ORAL | Status: DC
  Administered 2019-08-31 – 2019-09-03 (×4): 100 mg via ORAL
  Filled 2019-08-31 (×6): qty 1

## 2019-08-31 MED ORDER — TRAZODONE HCL 50 MG PO TABS
50.0000 mg | ORAL_TABLET | Freq: Every evening | ORAL | Status: DC | PRN
  Administered 2019-09-01 – 2019-09-02 (×2): 50 mg via ORAL
  Filled 2019-08-31 (×3): qty 1

## 2019-08-31 MED ORDER — SERTRALINE HCL 25 MG PO TABS
25.0000 mg | ORAL_TABLET | Freq: Every day | ORAL | Status: DC
  Administered 2019-08-31 – 2019-09-03 (×4): 25 mg via ORAL
  Filled 2019-08-31 (×6): qty 1

## 2019-08-31 MED ORDER — ACETAMINOPHEN 325 MG PO TABS: 650.0000 mg | ORAL_TABLET | Freq: Four times a day (QID) | ORAL | Status: DC | PRN

## 2019-08-31 MED ORDER — HYDROXYZINE HCL 25 MG PO TABS
25.0000 mg | ORAL_TABLET | Freq: Three times a day (TID) | ORAL | Status: DC | PRN
  Administered 2019-08-31: 25 mg via ORAL
  Filled 2019-08-31 (×2): qty 1

## 2019-08-31 MED ORDER — FOLIC ACID 1 MG PO TABS
1.0000 mg | ORAL_TABLET | Freq: Every day | ORAL | Status: DC
  Administered 2019-08-31 – 2019-09-03 (×4): 1 mg via ORAL
  Filled 2019-08-31 (×6): qty 1

## 2019-08-31 NOTE — Progress Notes (Signed)
   08/31/19 1800  Charting Type  Charting Type Initial  Orders Chart Check (once per shift) Completed  Safety Check Verification  Has the RN verified the 15 minute safety check completion? Yes  Neurological  Neuro (WDL) WDL  HEENT  HEENT (WDL) WDL  Respiratory  Respiratory (WDL) WDL  Cardiac  Cardiac (WDL) WDL  Vascular  Vascular (WDL) WDL  Integumentary  Integumentary (WDL) WDL  Braden Scale (Ages 8 and up)  Sensory Perceptions 4  Moisture 4  Activity 4  Mobility 4  Nutrition 3  Friction and Shear 3  Braden Scale Score 22  Musculoskeletal  Musculoskeletal (WDL) WDL  Gastrointestinal  Gastrointestinal (WDL) WDL  GU Assessment  Genitourinary (WDL) WDL

## 2019-08-31 NOTE — Tx Team (Signed)
Initial Treatment Plan 08/31/2019 7:25 PM Paige Compton [id number]    PATIENT STRESSORS: Financial difficulties Occupational concerns   PATIENT STRENGTHS: Ability for insight Average or above average intelligence Capable of independent living Facilities manager fund of knowledge Motivation for treatment/growth Supportive family/friends   PATIENT IDENTIFIED PROBLEMS: "anxiety"  "depression"  "panic attacks"                 DISCHARGE CRITERIA:  Ability to meet basic life and health needs Adequate post-discharge living arrangements Improved stabilization in mood, thinking, and/or behavior Medical problems require only outpatient monitoring Motivation to continue treatment in a less acute level of care Need for constant or close observation no longer present Reduction of life-threatening or endangering symptoms to within safe limits Safe-care adequate arrangements made Verbal commitment to aftercare and medication compliance  PRELIMINARY DISCHARGE PLAN: Outpatient therapy Return to previous living arrangement Return to previous work or school arrangements  PATIENT/FAMILY INVOLVEMENT: This treatment plan has been presented to and reviewed with the patient, Paige Compton.  The patient and family have been given the opportunity to ask questions and make suggestions.  Grayland Ormond [place], [place] 08/31/2019, 7:25 PM

## 2019-08-31 NOTE — Progress Notes (Signed)
Report given to [place] at [facility].

## 2019-08-31 NOTE — Plan of Care (Signed)
Nurse discussed anxiety, depression and coping skills with patient.  

## 2019-08-31 NOTE — H&P (Signed)
Psychiatric Admission Assessment Adult  Patient Identification: Paige Compton MRN:  [id number] Date of Evaluation:  08/31/2019 Chief Complaint:  Depression [F32.9] Principal Diagnosis: <principal problem not specified> Diagnosis:  Active Problems:   Depression  History of Present Illness: Patient is seen and examined.  Patient is a 24 year old female with a reported past psychiatric history significant for depression and anxiety who presented to the [facility] emergency department on 08/30/2019 with suicidal ideation.  The patient stated that she delivered a healthy child in December 2019.  Since then she been under significant stressors.  She stated that after the birth of the child the biological father decided that he did not want to be in a relationship with the patient, and he began sleeping in another room.  The child slept in the room with her.  She was seen in her postpartum visit, and there was a suggestion of some postpartum depression, but no medications were started at that time.  Over the last year the biological father the child has not been helpful, and the patient stated that her family wants her to work at a job and also to be able to care for the child.  She stated sometimes when she gets frustrated and upset she will think about driving her car in a wall.  She worries at times that she might accidentally harm the child.  She has no intent of harming her child.  She denied any previous psychiatric admissions.  She stated she had cut herself in the past and the last time was at age 28.  She denied any drugs or alcohol.  She is currently working at [employer], and has in the past worked a Clinical cytogeneticist-.  She has a significant family history of suspected depression and 2 siblings who have been admitted to our facility in the past.  She admitted to increased anxiety, stress, feeling helpless, hopeless and worthless.  She was admitted to the hospital for evaluation and stabilization.  Associated  Signs/Symptoms: Depression Symptoms:  depressed mood, anhedonia, psychomotor retardation, fatigue, feelings of worthlessness/guilt, difficulty concentrating, hopelessness, suicidal thoughts without plan, anxiety, loss of energy/fatigue, disturbed sleep, weight gain, (Hypo) Manic Symptoms:  Labiality of Mood, Anxiety Symptoms:  Excessive Worry, Psychotic Symptoms:  denied PTSD Symptoms: Negative Total Time spent with patient: 30 minutes  Past Psychiatric History: Patient denied any previous psychiatric admissions.  Patient denied any previous psychiatric medications.  Patient denied any previous self-harm.  There was some suggestion of postpartum depression after the birth of her child in December 2019, but no medications or treatment were started at that time.  Is the patient at risk to self? Yes.    Has the patient been a risk to self in the past 6 months? No.  Has the patient been a risk to self within the distant past? No.  Is the patient a risk to others? No.  Has the patient been a risk to others in the past 6 months? No.  Has the patient been a risk to others within the distant past? No.   Prior Inpatient Therapy:   Prior Outpatient Therapy:    Alcohol Screening:   Substance Abuse History in the last 12 months:  No. Consequences of Substance Abuse: Negative Previous Psychotropic Medications: No  Psychological Evaluations: No  Past Medical History:  Past Medical History:  Diagnosis Date  . Medical history non-contributory     Past Surgical History:  Procedure Laterality Date  . NO PAST SURGERIES     Family History:  Family History  Problem Relation Age of Onset  . Cancer Maternal Aunt        breast  . Leukemia Maternal Uncle   . Cancer Paternal Uncle        pancreatic  . Hypertension Maternal Grandmother   . Healthy Mother   . Healthy Father    Family Psychiatric  History: Patient stated that she had 2 siblings who had been previously admitted to our  facility in the past.  She suspects that it was secondary to depression. Tobacco Screening:   Social History:  Social History   Substance and Sexual Activity  Alcohol Use Not Currently     Social History   Substance and Sexual Activity  Drug Use Not Currently    Additional Social History:                           Allergies:   Allergies  Allergen Reactions  . Latex Swelling   Lab Results:  Results for orders placed or performed during the hospital encounter of 08/30/19 (from the past 48 hour(s))  Comprehensive metabolic panel     Status: Abnormal   Collection Time: 08/30/19  7:10 PM  Result Value Ref Range   Sodium 137 135 - 145 mmol/L   Potassium 3.9 3.5 - 5.1 mmol/L   Chloride 108 98 - 111 mmol/L   CO2 24 22 - 32 mmol/L   Glucose, Bld 114 (H) 70 - 99 mg/dL    Comment: Glucose reference range applies only to samples taken after fasting for at least 8 hours.   BUN 11 6 - 20 mg/dL   Creatinine, Ser 0.88 0.44 - 1.00 mg/dL   Calcium 8.8 (L) 8.9 - 10.3 mg/dL   Total Protein 7.5 6.5 - 8.1 g/dL   Albumin 3.7 3.5 - 5.0 g/dL   AST 24 15 - 41 U/L   ALT 21 0 - 44 U/L   Alkaline Phosphatase 106 38 - 126 U/L   Total Bilirubin 0.5 0.3 - 1.2 mg/dL   GFR calc non Af Amer >60 >60 mL/min   GFR calc Af Amer >60 >60 mL/min   Anion gap 5 5 - 15    Comment: Performed at [facility], [address], [place], [place] [address]  Ethanol     Status: None   Collection Time: 08/30/19  7:10 PM  Result Value Ref Range   Alcohol, Ethyl (B) <10 <10 mg/dL    Comment: (NOTE) Lowest detectable limit for serum alcohol is 10 mg/dL. For medical purposes only. Performed at [facility], [address] [place], [place] [id number]   Salicylate level     Status: Abnormal   Collection Time: 08/30/19  7:10 PM  Result Value Ref Range   Salicylate Lvl [id number] (L) 7.0 - 30.0 mg/dL    Comment: Performed at [facility], [address], [place],  [address]  Acetaminophen level     Status:  Abnormal   Collection Time: 08/30/19  7:10 PM  Result Value Ref Range   Acetaminophen (Tylenol), Serum <10 (L) 10 - 30 ug/mL    Comment: (NOTE) Therapeutic concentrations vary significantly. A range of 10-30 ug/mL  may be an effective concentration for many patients. However, some  are best treated at concentrations outside of this range. Acetaminophen concentrations >150 ug/mL at 4 hours after ingestion  and >50 ug/mL at 12 hours after ingestion are often associated with  toxic reactions. Performed at [facility],  [address] [place], [place] [address]   cbc     Status: Abnormal   Collection Time: 08/30/19  7:10 PM  Result Value Ref Range   WBC 8.5 4.0 - 10.5 K/uL   RBC 5.92 (H) 3.87 - 5.11 MIL/uL   Hemoglobin 13.3 12.0 - 15.0 g/dL   HCT 42.9 36.0 - 46.0 %   MCV 72.5 (L) 80.0 - 100.0 fL   MCH 22.5 (L) 26.0 - 34.0 pg   MCHC 31.0 30.0 - 36.0 g/dL   RDW 15.9 (H) 11.5 - 15.5 %   Platelets 379 150 - 400 K/uL   nRBC 0.0 0.0 - 0.2 %    Comment: Performed at [facility], [address] [place], [place] [address]  Respiratory Panel by RT PCR (Flu A&B, Covid) - Nasopharyngeal Swab     Status: None   Collection Time: 08/30/19  8:49 PM   Specimen: Nasopharyngeal Swab  Result Value Ref Range   SARS Coronavirus 2 by RT PCR NEGATIVE NEGATIVE    Comment: (NOTE) SARS-CoV-2 target nucleic acids are NOT DETECTED. The SARS-CoV-2 RNA is generally detectable in upper respiratoy specimens during the acute phase of infection. The lowest concentration of SARS-CoV-2 viral copies this assay can detect is 131 copies/mL. A negative result does not preclude SARS-Cov-2 infection and should not be used as the sole basis for treatment or other patient management decisions. A negative result may occur with  improper specimen collection/handling, submission of specimen other than nasopharyngeal swab, presence of viral mutation(s) within the areas targeted by this assay, and inadequate number of viral  copies (<131 copies/mL). A negative result must be combined with clinical observations, patient history, and epidemiological information. The expected result is Negative. Fact Sheet for Patients:  PinkCheek.be Fact Sheet for Healthcare Providers:  GravelBags.it This test is not yet ap proved or cleared by the [place] FDA and  has been authorized for detection and/or diagnosis of SARS-CoV-2 by FDA under an Emergency Use Authorization (EUA). This EUA will remain  in effect (meaning this test can be used) for the duration of the COVID-19 declaration under Section 564(b)(1) of the Act, 21 U.S.C. section 360bbb-3(b)(1), unless the authorization is terminated or revoked sooner.    Influenza A by PCR NEGATIVE NEGATIVE   Influenza B by PCR NEGATIVE NEGATIVE    Comment: (NOTE) The Xpert Xpress SARS-CoV-2/FLU/RSV assay is intended as an aid in  the diagnosis of influenza from Nasopharyngeal swab specimens and  should not be used as a sole basis for treatment. Nasal washings and  aspirates are unacceptable for Xpert Xpress SARS-CoV-2/FLU/RSV  testing. Fact Sheet for Patients: PinkCheek.be Fact Sheet for Healthcare Providers: GravelBags.it This test is not yet approved or cleared by the [place] FDA and  has been authorized for detection and/or diagnosis of SARS-CoV-2 by  FDA under an Emergency Use Authorization (EUA). This EUA will remain  in effect (meaning this test can be used) for the duration of the  Covid-19 declaration under Section 564(b)(1) of the Act, 21  U.S.C. section 360bbb-3(b)(1), unless the authorization is  terminated or revoked. Performed at [facility], [address], [place], [place] [address]   Rapid urine drug screen (hospital performed)     Status: None   Collection Time: 08/31/19  6:30 AM  Result Value Ref Range   Opiates NONE DETECTED NONE  DETECTED   Cocaine NONE DETECTED NONE DETECTED   Benzodiazepines NONE DETECTED NONE DETECTED   Amphetamines NONE DETECTED NONE DETECTED   Tetrahydrocannabinol  NONE DETECTED NONE DETECTED   Barbiturates NONE DETECTED NONE DETECTED    Comment: (NOTE) DRUG SCREEN FOR MEDICAL PURPOSES ONLY.  IF CONFIRMATION IS NEEDED FOR ANY PURPOSE, NOTIFY LAB WITHIN 5 DAYS. LOWEST DETECTABLE LIMITS FOR URINE DRUG SCREEN Drug Class                     Cutoff (ng/mL) Amphetamine and metabolites    1000 Barbiturate and metabolites    200 Benzodiazepine                 [id number] Tricyclics and metabolites     300 Opiates and metabolites        300 Cocaine and metabolites        300 THC                            50 Performed at [facility], [address] [place], [place] [address]     Blood Alcohol level:  Lab Results  Component Value Date   ETH <10 [id number]    Metabolic Disorder Labs:  No results found for: HGBA1C, MPG No results found for: PROLACTIN No results found for: CHOL, TRIG, HDL, CHOLHDL, VLDL, LDLCALC  Current Medications: Current Facility-Administered Medications  Medication Dose Route Frequency Provider Last Rate Last Admin  . acetaminophen (TYLENOL) tablet 650 mg  650 mg Oral Q6H PRN Sharma Covert, MD      . alum & mag hydroxide-simeth (MAALOX/MYLANTA) 200-200-20 MG/5ML suspension 30 mL  30 mL Oral Q4H PRN Sharma Covert, MD      . folic acid (FOLVITE) tablet 1 mg  1 mg Oral Daily Sharma Covert, MD      . hydrOXYzine (ATARAX/VISTARIL) tablet 25 mg  25 mg Oral TID PRN Sharma Covert, MD      . magnesium hydroxide (MILK OF MAGNESIA) suspension 30 mL  30 mL Oral Daily PRN Sharma Covert, MD      . sertraline (ZOLOFT) tablet 25 mg  25 mg Oral Daily Sharma Covert, MD      . thiamine tablet 100 mg  100 mg Oral Daily Sharma Covert, MD      . traZODone (DESYREL) tablet 50 mg  50 mg Oral QHS PRN Sharma Covert, MD       PTA Medications: No medications  prior to admission.    Musculoskeletal: Strength & Muscle Tone: within normal limits Gait & Station: normal Patient leans: N/A  Psychiatric Specialty Exam: Physical Exam  Nursing note and vitals reviewed. Constitutional: She is oriented to person, place, and time. She appears well-developed and well-nourished.  HENT:  Head: Normocephalic and atraumatic.  Respiratory: Effort normal.  Neurological: She is alert and oriented to person, place, and time.    Review of Systems  Last menstrual period 08/12/2019, unknown if currently breastfeeding.There is no height or weight on file to calculate BMI.  General Appearance: Casual  Eye Contact:  Good  Speech:  Normal Rate  Volume:  Normal  Mood:  Anxious and Depressed  Affect:  Congruent  Thought Process:  Coherent and Descriptions of Associations: Intact  Orientation:  Full (Time, Place, and Person)  Thought Content:  Logical  Suicidal Thoughts:  Yes.  without intent/plan  Homicidal Thoughts:  No  Memory:  Immediate;   Good Recent;   Good Remote;   Good  Judgement:  Intact  Insight:  Fair  Psychomotor Activity:  Decreased  Concentration:  Concentration: Good and Attention Span: Good  Recall:  Good  Fund of Knowledge:  Good  Language:  Good  Akathisia:  Negative  Handed:  Right  AIMS (if indicated):     Assets:  Communication Skills Desire for Improvement Housing Resilience Social Support Talents/Skills  ADL's:  Intact  Cognition:  WNL  Sleep:       Treatment Plan Summary: Daily contact with patient to assess and evaluate symptoms and progress in treatment, Medication management and Plan : Patient is seen and examined.  Patient is a 24 year old female with a past psychiatric history significant for probable major depression, generalized anxiety disorder, poor coping skills and perhaps posttraumatic stress disorder.  She will be admitted to the hospital.  She will be integrated into the milieu.  She will be encouraged to  attend groups.  We will start her on Zoloft 25 mg p.o. daily and this to be titrated throughout the course of hospitalization.  She also have available hydroxyzine for anxiety and trazodone for sleep.  The patient will require psychiatric follow-up after discharge, and would benefit from psychotherapy as well as parenting classes.  She denied any drugs or alcohol.  She has no reported past medical history.  Review of her laboratories revealed normal electrolytes, a decreased MCV and MCH.  Platelets were normal.  Acetaminophen and salicylate were both negative.  Blood alcohol was less than 10.  Drug screen was completely negative.  Secondary to the decreased MCV we will start her on folic acid as well as thiamine.  Observation Level/Precautions:  15 minute checks  Laboratory:  Chemistry Profile  Psychotherapy:    Medications:    Consultations:    Discharge Concerns:    Estimated LOS:  Other:     Physician Treatment Plan for Primary Diagnosis: <principal problem not specified> Long Term Goal(s): Improvement in symptoms so as ready for discharge  Short Term Goals: Ability to identify changes in lifestyle to reduce recurrence of condition will improve, Ability to verbalize feelings will improve, Ability to disclose and discuss suicidal ideas, Ability to demonstrate self-control will improve, Ability to identify and develop effective coping behaviors will improve and Ability to maintain clinical measurements within normal limits will improve  Physician Treatment Plan for Secondary Diagnosis: Active Problems:   Depression  Long Term Goal(s): Improvement in symptoms so as ready for discharge  Short Term Goals: Ability to identify changes in lifestyle to reduce recurrence of condition will improve, Ability to verbalize feelings will improve, Ability to disclose and discuss suicidal ideas, Ability to demonstrate self-control will improve, Ability to identify and develop effective coping behaviors will  improve and Ability to maintain clinical measurements within normal limits will improve  I certify that inpatient services furnished can reasonably be expected to improve the patient's condition.    Sharma Covert, MD 3/11/202111:17 AM

## 2019-08-31 NOTE — Progress Notes (Signed)
[place] Group Notes:  (Nursing/MHT/Case Management/Adjunct)  Date:  08/31/2019  Time:  2030  Type of Therapy:  wrap up group  Participation Level:  Active  Participation Quality:  Appropriate, Inattentive, Redirectable and Sharing  Affect:  Appropriate  Cognitive:  Alert  Insight:  Improving  Engagement in Group:  Engaged  Modes of Intervention:  Clarification, Education and Support  Summary of Progress/Problems: Positive thinking and positive change were discussed.   Shellia Cleverly 08/31/2019, 10:22 PM

## 2019-08-31 NOTE — ED Notes (Signed)
Spoke with Ebony Hail from [employer], sending driver out to [employer] at this time

## 2019-08-31 NOTE — Progress Notes (Signed)
Pt ambulatory, A&O x4 no s/s of distress no complaints voiced. Attempted to call report to [facility] awaiting for return call from accepting nurse. [employer] transported pt off property. No belongings given. All belongings were given to family member on prior shift.

## 2019-08-31 NOTE — Progress Notes (Signed)
   08/31/19 2200  Psych Admission Type (Psych Patients Only)  Admission Status Voluntary  Psychosocial Assessment  Patient Complaints Anxiety;Depression  Eye Contact Brief  Facial Expression Anxious;Worried;Sad  Affect Sad;Anxious;Apprehensive;Depressed  Speech Logical/coherent  Interaction Minimal  Motor Activity  (appropriate)  Appearance/Hygiene Unremarkable  Behavior Characteristics Cooperative  Mood Depressed  Thought Process  Coherency WDL  Content WDL  Delusions None reported or observed  Perception WDL  Hallucination None reported or observed  Judgment Impaired  Confusion None  Danger to Self  Current suicidal ideation? Denies  Danger to Others  Danger to Others None reported or observed

## 2019-08-31 NOTE — ED Notes (Signed)
Called [employer] @ 0700 and 0725 no answer. Left message on answering service.

## 2019-08-31 NOTE — BHH Suicide Risk Assessment (Signed)
[facility] Admission Suicide Risk Assessment   Nursing information obtained from:    Demographic factors:    Current Mental Status:    Loss Factors:    Historical Factors:    Risk Reduction Factors:     Total Time spent with patient: 30 minutes Principal Problem: <principal problem not specified> Diagnosis:  Active Problems:   Depression  Subjective Data: Patient is seen and examined.  Patient is a 24 year old female with a reported past psychiatric history significant for depression and anxiety who presented to the [facility] emergency department on 08/30/2019 with suicidal ideation.  The patient stated that she delivered a healthy child in December 2019.  Since then she been under significant stressors.  She stated that after the birth of the child the biological father decided that he did not want to be in a relationship with the patient, and he began sleeping in another room.  The child slept in the room with her.  She was seen in her postpartum visit, and there was a suggestion of some postpartum depression, but no medications were started at that time.  Over the last year the biological father the child has not been helpful, and the patient stated that her family wants her to work at a job and also to be able to care for the child.  She stated sometimes when she gets frustrated and upset she will think about driving her car in a wall.  She worries at times that she might accidentally harm the child.  She has no intent of harming her child.  She denied any previous psychiatric admissions.  She stated she had cut herself in the past and the last time was at age 42.  She denied any drugs or alcohol.  She is currently working at [employer], and has in the past worked a Clinical cytogeneticist-.  She has a significant family history of suspected depression and 2 siblings who have been admitted to our facility in the past.  She admitted to increased anxiety, stress, feeling helpless, hopeless and worthless.  She was admitted to  the hospital for evaluation and stabilization.  Continued Clinical Symptoms:    The "Alcohol Use Disorders Identification Test", Guidelines for Use in Primary Care, Second Edition.  World Pharmac[facility]). Score between 0-7:  no or low risk or alcohol related problems. Score between 8-15:  moderate risk of alcohol related problems. Score between 16-19:  high risk of alcohol related problems. Score 20 or above:  warrants further diagnostic evaluation for alcohol dependence and treatment.   CLINICAL FACTORS:   Severe Anxiety and/or Agitation Depression:   Anhedonia Hopelessness Impulsivity Insomnia   Musculoskeletal: Strength & Muscle Tone: within normal limits Gait & Station: normal Patient leans: N/A  Psychiatric Specialty Exam: Physical Exam  Nursing note and vitals reviewed. Constitutional: She is oriented to person, place, and time. She appears well-developed and well-nourished.  HENT:  Head: Normocephalic and atraumatic.  Respiratory: Effort normal.  Neurological: She is alert and oriented to person, place, and time.    Review of Systems  Last menstrual period 08/12/2019, unknown if currently breastfeeding.There is no height or weight on file to calculate BMI.  General Appearance: Casual  Eye Contact:  Good  Speech:  Normal Rate  Volume:  Normal  Mood:  Anxious and Depressed  Affect:  Congruent  Thought Process:  Coherent and Descriptions of Associations: Intact  Orientation:  Full (Time, Place, and Person)  Thought Content:  Logical  Suicidal Thoughts:  Yes.  without intent/plan  Homicidal Thoughts:  No  Memory:  Immediate;   Good Recent;   Good Remote;   Good  Judgement:  Intact  Insight:  Fair  Psychomotor Activity:  Normal  Concentration:  Concentration: Good and Attention Span: Good  Recall:  Good  Fund of Knowledge:  Good  Language:  Good  Akathisia:  Negative  Handed:  Right  AIMS (if indicated):     Assets:  Communication Skills Desire  for [place] Talents/Skills  ADL's:  Intact  Cognition:  WNL  Sleep:         COGNITIVE FEATURES THAT CONTRIBUTE TO RISK:  None    SUICIDE RISK:   Mild:  Suicidal ideation of limited frequency, intensity, duration, and specificity.  There are no identifiable plans, no associated intent, mild dysphoria and related symptoms, good self-control (both objective and subjective assessment), few other risk factors, and identifiable protective factors, including available and accessible social support.  PLAN OF CARE: Patient is seen and examined.  Patient is a 24 year old female with a past psychiatric history significant for probable major depression, generalized anxiety disorder, poor coping skills and perhaps posttraumatic stress disorder.  She will be admitted to the hospital.  She will be integrated into the milieu.  She will be encouraged to attend groups.  We will start her on Zoloft 25 mg p.o. daily and this to be titrated throughout the course of hospitalization.  She also have available hydroxyzine for anxiety and trazodone for sleep.  The patient will require psychiatric follow-up after discharge, and would benefit from psychotherapy as well as parenting classes.  She denied any drugs or alcohol.  She has no reported past medical history.  Review of her laboratories revealed normal electrolytes, a decreased MCV and MCH.  Platelets were normal.  Acetaminophen and salicylate were both negative.  Blood alcohol was less than 10.  Drug screen was completely negative.  Secondary to the decreased MCV we will start her on folic acid as well as thiamine.  I certify that inpatient services furnished can reasonably be expected to improve the patient's condition.   Sharma Covert, MD 08/31/2019, 10:57 AM

## 2019-08-31 NOTE — Progress Notes (Signed)
Adult Psychoeducational Group Note  Date:  08/31/2019 Time:  6:37 PM  Group Topic/Focus:  Coping With Mental Health Crisis:   The purpose of this group is to help patients identify strategies for coping with mental health crisis.  Group discusses possible causes of crisis and ways to manage them effectively.  Participation Level:  Active  Participation Quality:  Appropriate and Attentive  Affect:  Appropriate  Cognitive:  Alert  Insight: Appropriate  Engagement in Group:  Engaged  Modes of Intervention:  Discussion  Additional Comments:  Pt attended group and participated in discussion.  Allysia Ingles R Tennyson Kallen 08/31/2019, 6:37 PM
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
## 2019-08-31 NOTE — Progress Notes (Signed)
Patient is 24 yr old female, voluntary from [facility], became suicidal one month ago.  Work and people stress.  Had thoughts to run her car off street.  Presently patient is worried about her stay at [facility] and her job.  Never married, has one daughter age one yr old.  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 12th grade education.   Had been SI in the past, but denied SI during admission.  Does not have any desire to hurt her child.  Denied A/V hallucinations.  Rated depression 8, hopeless and anxiety #9.  Patient stated she has panic attacks, sometimes while in her car.    Skin assessment:  Tattoo on R upper foot and bilateral lower arms.  Bruise on R knee. Patient stated she does not use drugs, alcohol or tobacco.  No locker needed. Patient has been cooperative and pleasant.  Patient given food/drink, oriented to [address] hall.

## 2019-09-01 DIAGNOSIS — F339 Major depressive disorder, recurrent, unspecified: Secondary | ICD-10-CM

## 2019-09-01 NOTE — Progress Notes (Signed)
Patient ID: Paige Compton, female   DOB: 1995-08-21, 24 y.o.   MRN: [id number]   D: Patient very pleasant on approach tonight. Reports mood improvement and contracts for safety on the unit.  A: Staff will monitor q 15 minutes, give medications, and follow treatment plan as ordered. R: Calm on the unit.

## 2019-09-01 NOTE — Tx Team (Signed)
Interdisciplinary Treatment and Diagnostic Plan Update  09/01/2019 Time of Session: 9:15am Geanne Nishioka MRN: [id number]  Principal Diagnosis: <principal problem not specified>  Secondary Diagnoses: Active Problems:   Depression   Current Medications:  Current Facility-Administered Medications  Medication Dose Route Frequency Provider Last Rate Last Admin  . acetaminophen (TYLENOL) tablet 650 mg  650 mg Oral Q6H PRN Sharma Covert, MD      . alum & mag hydroxide-simeth (MAALOX/MYLANTA) 200-200-20 MG/5ML suspension 30 mL  30 mL Oral Q4H PRN Sharma Covert, MD      . folic acid (FOLVITE) tablet 1 mg  1 mg Oral Daily Sharma Covert, MD   1 mg at 08/31/19 1215  . hydrOXYzine (ATARAX/VISTARIL) tablet 25 mg  25 mg Oral TID PRN Sharma Covert, MD   25 mg at 08/31/19 1235  . magnesium hydroxide (MILK OF MAGNESIA) suspension 30 mL  30 mL Oral Daily PRN Sharma Covert, MD      . sertraline (ZOLOFT) tablet 25 mg  25 mg Oral Daily Sharma Covert, MD   25 mg at 08/31/19 1215  . thiamine tablet 100 mg  100 mg Oral Daily Sharma Covert, MD   100 mg at 08/31/19 1215  . traZODone (DESYREL) tablet 50 mg  50 mg Oral QHS PRN Sharma Covert, MD       PTA Medications: No medications prior to admission.    Patient Stressors: Financial difficulties Occupational concerns  Patient Strengths: Ability for insight Average or above average intelligence Capable of independent living Facilities manager fund of knowledge Motivation for treatment/growth Supportive family/friends  Treatment Modalities: Medication Management, Group therapy, Case management,  1 to 1 session with clinician, Psychoeducation, Recreational therapy.   Physician Treatment Plan for Primary Diagnosis: <principal problem not specified> Long Term Goal(s): Improvement in symptoms so as ready for discharge Improvement in symptoms so as ready for discharge   Short Term Goals: Ability to identify  changes in lifestyle to reduce recurrence of condition will improve Ability to verbalize feelings will improve Ability to disclose and discuss suicidal ideas Ability to demonstrate self-control will improve Ability to identify and develop effective coping behaviors will improve Ability to maintain clinical measurements within normal limits will improve Ability to identify changes in lifestyle to reduce recurrence of condition will improve Ability to verbalize feelings will improve Ability to disclose and discuss suicidal ideas Ability to demonstrate self-control will improve Ability to identify and develop effective coping behaviors will improve Ability to maintain clinical measurements within normal limits will improve  Medication Management: Evaluate patient's response, side effects, and tolerance of medication regimen.  Therapeutic Interventions: 1 to 1 sessions, Unit Group sessions and Medication administration.  Evaluation of Outcomes: Progressing  Physician Treatment Plan for Secondary Diagnosis: Active Problems:   Depression  Long Term Goal(s): Improvement in symptoms so as ready for discharge Improvement in symptoms so as ready for discharge   Short Term Goals: Ability to identify changes in lifestyle to reduce recurrence of condition will improve Ability to verbalize feelings will improve Ability to disclose and discuss suicidal ideas Ability to demonstrate self-control will improve Ability to identify and develop effective coping behaviors will improve Ability to maintain clinical measurements within normal limits will improve Ability to identify changes in lifestyle to reduce recurrence of condition will improve Ability to verbalize feelings will improve Ability to disclose and discuss suicidal ideas Ability to demonstrate self-control will improve Ability to identify and develop effective coping behaviors will improve  Ability to maintain clinical measurements within  normal limits will improve     Medication Management: Evaluate patient's response, side effects, and tolerance of medication regimen.  Therapeutic Interventions: 1 to 1 sessions, Unit Group sessions and Medication administration.  Evaluation of Outcomes: Progressing   RN Treatment Plan for Primary Diagnosis: <principal problem not specified> Long Term Goal(s): Knowledge of disease and therapeutic regimen to maintain health will improve  Short Term Goals: Ability to participate in decision making will improve, Ability to verbalize feelings will improve, Ability to disclose and discuss suicidal ideas, Ability to identify and develop effective coping behaviors will improve and Compliance with prescribed medications will improve  Medication Management: RN will administer medications as ordered by provider, will assess and evaluate patient's response and provide education to patient for prescribed medication. RN will report any adverse and/or side effects to prescribing provider.  Therapeutic Interventions: 1 on 1 counseling sessions, Psychoeducation, Medication administration, Evaluate responses to treatment, Monitor vital signs and CBGs as ordered, Perform/monitor CIWA, COWS, AIMS and Fall Risk screenings as ordered, Perform wound care treatments as ordered.  Evaluation of Outcomes: Progressing   LCSW Treatment Plan for Primary Diagnosis: <principal problem not specified> Long Term Goal(s): Safe transition to appropriate next level of care at discharge, Engage patient in therapeutic group addressing interpersonal concerns.  Short Term Goals: Engage patient in aftercare planning with referrals and resources  Therapeutic Interventions: Assess for all discharge needs, 1 to 1 time with Social worker, Explore available resources and support systems, Assess for adequacy in community support network, Educate family and significant other(s) on suicide prevention, Complete Psychosocial Assessment,  Interpersonal group therapy.  Evaluation of Outcomes: Progressing   Progress in Treatment: Attending groups: No. New to unit  Participating in groups: No. Taking medication as prescribed: Yes. Toleration medication: Yes. Family/Significant other contact made: No, will contact:  the patient's friend Patient understands diagnosis: Yes. Discussing patient identified problems/goals with staff: Yes. Medical problems stabilized or resolved: Yes. Denies suicidal/homicidal ideation: Yes. Issues/concerns per patient self-inventory: No. Other:   New problem(s) identified: None  New Short Term/Long Term Goal(s): medication stabilization, elimination of SI thoughts, development of comprehensive mental wellness plan.    Patient Goals:  "To not have thoughts of harming myself or thoughts of harming my child. I just worry about her safety sometimes because she is so small"   Discharge Plan or Barriers: Patient plans to return home with her mother and family. She will follow up with Tamela Gammon for medication management and therapy services. CSW will continue to follow and assess for appropriate referrals and possible discharge planning.    Reason for Continuation of Hospitalization: Anxiety Depression Medication stabilization Suicidal ideation  Estimated Length of Stay: 3-5 days  Attendees: Patient: Paige Compton 09/01/2019 8:53 AM  Physician: Dr. Myles Lipps, MD 09/01/2019 8:53 AM  Nursing:  09/01/2019 8:53 AM  RN Care Manager: 09/01/2019 8:53 AM  Social Worker: Radonna Ricker, LCSW 09/01/2019 8:53 AM  Recreational Therapist:  09/01/2019 8:53 AM  Other:  09/01/2019 8:53 AM  Other:  09/01/2019 8:53 AM  Other: 09/01/2019 8:53 AM    Scribe for Treatment Team: Marylee Floras, Fernando Salinas 09/01/2019 8:53 AM

## 2019-09-01 NOTE — Progress Notes (Signed)
[facility] MD Progress Note  09/01/2019 12:41 PM Josiana Hugel  MRN:  [id number]   Principal Problem: Major depressive disorder, recurrent ([place])   Diagnosis: Principal Problem:   Major depressive disorder, recurrent ([place]) Active Problems:   Depression  Subjective:  "I am good today."  Objective: Jamylah Nicklas is a 24 y.o female who presented to [facility] Emergency Department on 08/30/2019 with suicidal ideation. On evaluation today, the patient is alert and oriented x 4, pleasant, and cooperative. Speech is clear an coherent. Mood is depressed and anxious. Affect is congruent with mood,. She has been visible on the unit participating in group sessions and interacting with peers and staff. She rates her depression today as 3 out of 10 and anxiety as 4 out 10 with 10 being the most severe. She reports that she slept from approximately 11 pm last night until 7 am this morning. States that she may have woken up once or twice. Sleep hours were not located on chart review. He reports her appetite as good. She was started on sertraline 25 mg on 08/31/2019. She states that she is tolerating well without any side effects. She denies suicidal thoughts. She is able to contract for safety while in the hospital. She denies homicidal ideations. She denies auditory and visual hallucinations. She is interested in medication management and therapy after discharge.   From Admission H&P: Patient is a 24 year old female with a past psychiatric history significant for probable major depression, generalized anxiety disorder, poor coping skills and perhaps posttraumatic stress disorder.  She will be admitted to the hospital.  She will be integrated into the milieu.  She will be encouraged to attend groups.  We will start her on Zoloft 25 mg p.o. daily and this to be titrated throughout the course of hospitalization.  She also have available hydroxyzine for anxiety and trazodone for sleep.  The patient will require psychiatric  follow-up after discharge, and would benefit from psychotherapy as well as parenting classes.  She denied any drugs or alcohol.  She has no reported past medical history.  Review of her laboratories revealed normal electrolytes, a decreased MCV and MCH.  Platelets were normal.  Acetaminophen and salicylate were both negative.  Blood alcohol was less than 10.  Drug screen was completely negative.  Secondary to the decreased MCV we will start her on folic acid as well as thiamine.  Total Time spent with patient: 15 minutes  Past Psychiatric History: See admission H&P  Past Medical History:  Past Medical History:  Diagnosis Date  . Anxiety   . Depression   . Medical history non-contributory     Past Surgical History:  Procedure Laterality Date  . NO PAST SURGERIES     Family History:  Family History  Problem Relation Age of Onset  . Cancer Maternal Aunt        breast  . Leukemia Maternal Uncle   . Cancer Paternal Uncle        pancreatic  . Hypertension Maternal Grandmother   . Healthy Mother   . Healthy Father    Family Psychiatric  History: See admission H&P Social History:  Social History   Substance and Sexual Activity  Alcohol Use Not Currently     Social History   Substance and Sexual Activity  Drug Use Not Currently    Social History   Socioeconomic History  . Marital status: Single    Spouse name: Not on file  . Number of children: Not on file  .  Years of education: Not on file  . Highest education level: Not on file  Occupational History  . Not on file  Tobacco Use  . Smoking status: Never Smoker  . Smokeless tobacco: Never Used  Substance and Sexual Activity  . Alcohol use: Not Currently  . Drug use: Not Currently  . Sexual activity: Yes    Birth control/protection: None  Other Topics Concern  . Not on file  Social History Narrative  . Not on file   Social Determinants of Health   Financial Resource Strain:   . Difficulty of Paying Living  Expenses:   Food Insecurity:   . Worried About Charity fundraiser in the Last Year:   . Arboriculturist in the Last Year:   Transportation Needs:   . Film/video editor (Medical):   Marland Kitchen Lack of Transportation (Non-Medical):   Physical Activity:   . Days of Exercise per Week:   . Minutes of Exercise per Session:   Stress:   . Feeling of Stress :   Social Connections:   . Frequency of Communication with Friends and Family:   . Frequency of Social Gatherings with Friends and Family:   . Attends Religious Services:   . Active Member of Clubs or Organizations:   . Attends Archivist Meetings:   Marland Kitchen Marital Status:    Additional Social History:    Pain Medications: see MAR Prescriptions: see MAR Over the Counter: see MAR History of alcohol / drug use?: No history of alcohol / drug abuse Negative Consequences of Use: (denied using drugs/alcohol) Withdrawal Symptoms: Other (Comment)(denied withdrawals, none user)     Sleep: Good  Appetite:  Good  Current Medications: Current Facility-Administered Medications  Medication Dose Route Frequency Provider Last Rate Last Admin  . acetaminophen (TYLENOL) tablet 650 mg  650 mg Oral Q6H PRN Sharma Covert, MD      . alum & mag hydroxide-simeth (MAALOX/MYLANTA) 200-200-20 MG/5ML suspension 30 mL  30 mL Oral Q4H PRN Sharma Covert, MD      . folic acid (FOLVITE) tablet 1 mg  1 mg Oral Daily Sharma Covert, MD   1 mg at 09/01/19 0908  . hydrOXYzine (ATARAX/VISTARIL) tablet 25 mg  25 mg Oral TID PRN Sharma Covert, MD   25 mg at 08/31/19 1235  . magnesium hydroxide (MILK OF MAGNESIA) suspension 30 mL  30 mL Oral Daily PRN Sharma Covert, MD      . sertraline (ZOLOFT) tablet 25 mg  25 mg Oral Daily Sharma Covert, MD   25 mg at 09/01/19 0909  . thiamine tablet 100 mg  100 mg Oral Daily Sharma Covert, MD   100 mg at 09/01/19 0909  . traZODone (DESYREL) tablet 50 mg  50 mg Oral QHS PRN Sharma Covert, MD         Lab Results:  Results for orders placed or performed during the hospital encounter of 08/31/19 (from the past 48 hour(s))  TSH     Status: None   Collection Time: 08/31/19  6:17 PM  Result Value Ref Range   TSH 1.512 0.350 - 4.500 uIU/mL    Comment: Performed by a 3rd Generation assay with a functional sensitivity of <=0.01 uIU/mL. Performed at [facility], [place] [address] [place], [place] [address]   hCG, quantitative, pregnancy     Status: None   Collection Time: 08/31/19  6:17 PM  Result Value Ref Range  hCG, Beta Chain, Quant, S <1 <5 mIU/mL    Comment:          GEST. AGE      CONC.  (mIU/mL)   <=1 WEEK        5 - 50     2 WEEKS       50 - 500     3 WEEKS       100 - 10,000     4 WEEKS     1,000 - 30,000     5 WEEKS     3,500 - 115,000   6-8 WEEKS     12,000 - 270,000    12 WEEKS     15,000 - 220,000        FEMALE AND NON-PREGNANT FEMALE:     LESS THAN 5 mIU/mL Performed at [facility], [place] [address] [place], [place] [address]     Blood Alcohol level:  Lab Results  Component Value Date   ETH <10 [id number]    Metabolic Disorder Labs: No results found for: HGBA1C, MPG No results found for: PROLACTIN No results found for: CHOL, TRIG, HDL, CHOLHDL, VLDL, LDLCALC  Physical Findings: AIMS: Facial and Oral Movements Muscles of Facial Expression: None, normal Lips and Perioral Area: None, normal Jaw: None, normal Tongue: None, normal,Extremity Movements Upper (arms, wrists, hands, fingers): None, normal Lower (legs, knees, ankles, toes): None, normal, Trunk Movements Neck, shoulders, hips: None, normal, Overall Severity Severity of abnormal movements (highest score from questions above): None, normal Incapacitation due to abnormal movements: None, normal Patient's awareness of abnormal movements (rate only patient's report): No Awareness, Dental Status Current problems with teeth and/or dentures?: No Does patient  usually wear dentures?: No  CIWA:  CIWA-Ar Total: 2 COWS:  COWS Total Score: 3  Musculoskeletal: Strength & Muscle Tone: within normal limits Gait & Station: normal Patient leans: N/A  Psychiatric Specialty Exam: Physical Exam  Vitals reviewed. Constitutional: She is oriented to person, place, and time. She appears well-developed and well-nourished. No distress.  Respiratory: Effort normal. No respiratory distress.  Musculoskeletal:        General: Normal range of motion.  Neurological: She is alert and oriented to person, place, and time.  Skin: Skin is warm and dry. She is not diaphoretic.  Psychiatric: Her mood appears anxious. She is not withdrawn and not actively hallucinating. Thought content is not paranoid and not delusional. She exhibits a depressed mood. She expresses no homicidal and no suicidal ideation.    Review of Systems  Constitutional: Negative for activity change, appetite change, chills, diaphoresis, fatigue, fever and unexpected weight change.  HENT: Negative for congestion and sore throat.   Respiratory: Negative for cough and shortness of breath.   Cardiovascular: Negative for chest pain.  Gastrointestinal: Negative for diarrhea, nausea and vomiting.  Neurological: Negative for dizziness and headaches.  Psychiatric/Behavioral: Positive for dysphoric mood. Negative for hallucinations, self-injury, sleep disturbance and suicidal ideas. The patient is nervous/anxious. The patient is not hyperactive.   All other systems reviewed and are negative.   Blood pressure 105/84, pulse (!) 111, temperature (!) 97.4 F (36.3 C), temperature source Oral, resp. rate 16, height 4' 9.5" (1.461 m), weight 71.2 kg, last menstrual period 08/12/2019, SpO2 99 %, not currently breastfeeding.Body mass index is 33.39 kg/m.  General Appearance: Casual  Eye Contact:  Good  Speech:  Clear and Coherent and Normal Rate  Volume:  Normal  Mood:  Anxious and Depressed  Affect:  Congruent  Thought Process:  Coherent, Goal Directed and Descriptions of Associations: Intact  Orientation:  Full (Time, Place, and Person)  Thought Content:  Logical  Suicidal Thoughts:  Denies  Homicidal Thoughts:  No  Memory:  Immediate;   Good Recent;   Good Remote;   Good  Judgement:  Intact  Insight:  Fair  Psychomotor Activity:  Normal  Concentration:  Concentration: Good and Attention Span: Good  Recall:  Good  Fund of Knowledge:  Good  Language:  Good  Akathisia:  Negative  Handed:  Right  AIMS (if indicated):     Assets:  Communication Skills Desire for Improvement Housing Resilience Social Support  ADL's:  Intact  Cognition:  WNL  Sleep:        Treatment Plan Summary: Daily contact with patient to assess and evaluate symptoms and progress in treatment and Medication management   Continue sertraline 25 mg daily for depression/anxiety Continue hydroxyzine every 6 hours prn for anxiety Continue trazodone 50 mg QHS prn for sleep Continue thiamine 100 mg daily for nutritional supplementation Continue folic acid 1 mg daily for nutritional supplementation/decreased MCV  Rozetta Nunnery, NP 09/01/2019, 12:41 PM

## 2019-09-01 NOTE — Progress Notes (Signed)
Recreation Therapy Notes  Date:  3.12.21 Time: 0930 Location: [address] Dayroom  Group Topic: Stress Management  Goal Area(s) Addresses:  Patient will identify positive stress management techniques. Patient will identify benefits of using stress management post d/c.  Behavioral Response: Engaged  Intervention: Stress Management  Activity :  Meditation.  LRT played a meditation that focused on being resilient in the face of adversity.  Patients were to listen as meditation played to engage in activity.  Education:  Stress Management, Discharge Planning.   Education Outcome: Acknowledges Education  Clinical Observations/Feedback: Pt attended and participated in activity.    Victorino Sparrow, LRT/CTRS        Victorino Sparrow A 09/01/2019 10:52 AM

## 2019-09-01 NOTE — [facility] Group Notes (Signed)
Type of Therapy/Topic: Identifying Irrational Beliefs/Thoughts  Participation Level: Active  Description of Group: The purpose of this group is to assist patients in learning to identify irrational beliefs and thoughts that contribute to their negative emotions and experience positive emotions. Patients will be guided to discuss ways in which they have been effected by irrational thoughts and beliefs and how to transform those irrational beliefs into rational ones. Newly identified rational beliefs will be juxtaposed with experiences of positive emotions or situations, and patients will be challenged to use rational beliefs or thoughts to combat negative ones. Special emphasis will be placed on coping with irrational beliefs in conflict situations, and patients will process healthy conflict resolution skills.  Therapeutic Goals: 1. Patient will identify two irrational thoughts or beliefs  to reflect on in order to balance out those thoughts 2. Patient will label two or more irrational thoughts/beliefs that they find the most difficult to cope with 3. Patient will demonstrate positive conflict resolution skills through discussion and/or role plays that will assist in transforming irrational thoughts or beliefs into positive ones.  Summary of Patient Progress:  Psycho-education worksheet provided. CSW and patient's discussed worksheet and addressed any questions. Patient's kept worksheet as a reference for health coping skills.    Therapeutic Modalities: Cognitive Behavioral Therapy Feelings Identification Dialectical Behavioral Therapy

## 2019-09-01 NOTE — [facility] Suicide Risk Assessment (Cosign Needed Addendum)
[place] INPATIENT:  Family/Significant Other Suicide Prevention Education  Suicide Prevention Education:  Education Completed; with friend, Paige Compton [phone]) has been identified by the patient as the family member/significant other with whom the patient will be residing, and identified as the person(s) who will aid the patient in the event of a mental health crisis (suicidal ideations/suicide attempt).  With written consent from the patient, the family member/significant other has been provided the following suicide prevention education, prior to the and/or following the discharge of the patient.  The suicide prevention education provided includes the following:  Suicide risk factors  Suicide prevention and interventions  National Suicide Hotline telephone number  [facility] assessment telephone number  [facility] Emergency Assistance [place] and/or Residential Mobile Crisis Unit telephone number  Request made of family/significant other to:  Remove weapons (e.g., guns, rifles, knives), all items previously/currently identified as safety concern.    Remove drugs/medications (over-the-counter, prescriptions, illicit drugs), all items previously/currently identified as a safety concern.  The family member/significant other verbalizes understanding of the suicide prevention education information provided.  The family member/significant other agrees to remove the items of safety concern listed above.    Pt's friend had questions about if the pt has been cooperative and how she's been feeling.    Paige Compton 09/01/2019, 2:49 PM

## 2019-09-01 NOTE — Progress Notes (Signed)
Pt is alert and oriented to person, place and time. Pt is calm, cooperative, denies SI/HI/AVH. Pt is pleasant, social with peers and staff, spends time talking with peers in the dayroom, is medication complaint. No distress noted, will continue to monitor pt per Q15 minute face checks and monitor for safety and progress.

## 2019-09-01 NOTE — Progress Notes (Signed)
Pt completed her self inventory tool and reports her goal for the day is "Being the best person I can be for myself," by "consentrate, work on knowing I am enough."

## 2019-09-02 NOTE — [facility] Group Notes (Signed)
LCSW Group Therapy Note  09/02/2019    10:00-11:00am   Type of Therapy and Topic:  Group Therapy: Early Messages Received About Anger  Participation Level:  Active   Description of Group:   In this group, patients shared and discussed the early messages received in their lives about anger through parental or other adult modeling, teaching, repression, punishment, violence, and more.  Participants identified how those childhood lessons influence even now how they usually or often react when angered.  The group discussed that anger is a secondary emotion and what may be the underlying emotional themes that come out through anger outbursts or that are ignored through anger suppression.  Finally, as a group there was a conversation about the workbook's quote that "There is nothing wrong with anger; it is just a sign something needs to change."     Therapeutic Goals: Patients will identify one or more childhood message about anger that they received and how it was taught to them. Patients will discuss how these childhood experiences have influenced and continue to influence their own expression or repression of anger even today. Patients will explore possible primary emotions that tend to fuel their secondary emotion of anger. Patients will learn that anger itself is normal and cannot be eliminated, and that healthier coping skills can assist with resolving conflict rather than worsening situations.  Summary of Patient Progress:  The patient shared that her childhood lessons about anger were from her mother who taught her that "you just deal with it."  Her mother did not show anger.  As a result, the patient said she does not know how to show emotions and wants to learn how. She wants to be able to model for her own child a healthy way to feel feelings.  She said she will "cry it out" when angry.  Therapeutic Modalities:   Cognitive Behavioral Therapy Motivation Interviewing  Paige Compton   .

## 2019-09-02 NOTE — Progress Notes (Signed)
   09/02/19 2259  Psych Admission Type (Psych Patients Only)  Admission Status Voluntary  Psychosocial Assessment  Patient Complaints None  Eye Contact Fair  Facial Expression Flat  Affect Appropriate to circumstance  Speech Logical/coherent  Interaction Assertive  Motor Activity Other (Comment) (WDL)  Appearance/Hygiene Unremarkable  Behavior Characteristics Appropriate to situation  Mood Pleasant  Thought Process  Coherency WDL  Content WDL  Delusions None reported or observed  Perception WDL  Hallucination None reported or observed  Judgment WDL  Confusion None  Danger to Self  Current suicidal ideation? Denies  Danger to Others  Danger to Others None reported or observed

## 2019-09-02 NOTE — Progress Notes (Signed)
[facility] Group Notes:  (Nursing/MHT/Case Management/Adjunct)  Date:  09/02/2019  Time: 2030  Type of Therapy:  wrap up group  Participation Level:  Active  Participation Quality:  Appropriate, Attentive, Sharing and Supportive  Affect:  Depressed  Cognitive:  Appropriate  Insight:  Improving  Engagement in Group:  Engaged  Modes of Intervention:  Clarification, Education and Support   Summary of Progress/Problems: Positive thinking and positive change were discussed.   Shellia Cleverly 09/02/2019, 9:01 PM

## 2019-09-02 NOTE — Progress Notes (Signed)
[facility] MD Progress Note  09/02/2019 1:18 PM Paige Compton  MRN:  [id number]   Principal Problem: Major depressive disorder, recurrent ([place])   Diagnosis: Principal Problem:   Major depressive disorder, recurrent ([place]) Active Problems:   Depression  Subjective: [name] reports, "I'm feeling great. Actually, I have no symptoms of depression or anxiety today".  Objective: Paige Compton is a 24 y.o female who presented to the [facility] on 08/30/2019 with suicidal ideation. On evaluation today, the patient is alert and oriented x 4, pleasant, and cooperative. Speech is clear an coherent. Mood is depressed and anxious. Affect is congruent with mood,. She has been visible on the unit participating in group sessions and interacting with peers and staff. She rates her depression today as 0 out of 10 and anxiety as 0 out 10 with 10 being the most severe. She reports that she slept well last night.  She remains on sertraline 25 mg on 08/31/2019. She states that she is tolerating her medications well without any side effects. She denies any suicidal thoughts. She is able to contract for safety while in the hospital. She denies homicidal ideations. She denies auditory and visual hallucinations. She does not appear to be responding to any internal stimuli. She is interested in medication management and therapy after discharge.   From Admission H&P: Patient is a 24 year old female with a past psychiatric history significant for probable major depression, generalized anxiety disorder, poor coping skills and perhaps posttraumatic stress disorder.  She will be admitted to the hospital.  She will be integrated into the milieu.  She will be encouraged to attend groups.  We will start her on Zoloft 25 mg p.o. daily and this to be titrated throughout the course of hospitalization.  She also have available hydroxyzine for anxiety and trazodone for sleep.  The patient will require psychiatric follow-up  after discharge, and would benefit from psychotherapy as well as parenting classes.  She denied any drugs or alcohol.  She has no reported past medical history.  Review of her laboratories revealed normal electrolytes, a decreased MCV and MCH.  Platelets were normal.  Acetaminophen and salicylate were both negative.  Blood alcohol was less than 10.  Drug screen was completely negative.  Secondary to the decreased MCV we will start her on folic acid as well as thiamine.  Total Time spent with patient: 15 minutes  Past Psychiatric History: See admission H&P  Past Medical History:  Past Medical History:  Diagnosis Date  . Anxiety   . Depression   . Medical history non-contributory     Past Surgical History:  Procedure Laterality Date  . NO PAST SURGERIES     Family History:  Family History  Problem Relation Age of Onset  . Cancer Maternal Aunt        breast  . Leukemia Maternal Uncle   . Cancer Paternal Uncle        pancreatic  . Hypertension Maternal Grandmother   . Healthy Mother   . Healthy Father    Family Psychiatric  History: See admission H&P  Social History:  Social History   Substance and Sexual Activity  Alcohol Use Not Currently     Social History   Substance and Sexual Activity  Drug Use Not Currently    Social History   Socioeconomic History  . Marital status: Single    Spouse name: Not on file  . Number of children: Not on file  . Years of education: Not on file  .  Highest education level: Not on file  Occupational History  . Not on file  Tobacco Use  . Smoking status: Never Smoker  . Smokeless tobacco: Never Used  Substance and Sexual Activity  . Alcohol use: Not Currently  . Drug use: Not Currently  . Sexual activity: Yes    Birth control/protection: None  Other Topics Concern  . Not on file  Social History Narrative  . Not on file   Social Determinants of Health   Financial Resource Strain:   . Difficulty of Paying Living Expenses:    Food Insecurity:   . Worried About Charity fundraiser in the Last Year:   . Arboriculturist in the Last Year:   Transportation Needs:   . Film/video editor (Medical):   Marland Kitchen Lack of Transportation (Non-Medical):   Physical Activity:   . Days of Exercise per Week:   . Minutes of Exercise per Session:   Stress:   . Feeling of Stress :   Social Connections:   . Frequency of Communication with Friends and Family:   . Frequency of Social Gatherings with Friends and Family:   . Attends Religious Services:   . Active Member of Clubs or Organizations:   . Attends Archivist Meetings:   Marland Kitchen Marital Status:    Additional Social History:    Pain Medications: see MAR Prescriptions: see MAR Over the Counter: see MAR History of alcohol / drug use?: No history of alcohol / drug abuse Negative Consequences of Use: (denied using drugs/alcohol) Withdrawal Symptoms: Other (Comment)(denied withdrawals, none user)  Sleep: Good  Appetite:  Good  Current Medications: Current Facility-Administered Medications  Medication Dose Route Frequency Provider Last Rate Last Admin  . acetaminophen (TYLENOL) tablet 650 mg  650 mg Oral Q6H PRN Sharma Covert, MD      . alum & mag hydroxide-simeth (MAALOX/MYLANTA) 200-200-20 MG/5ML suspension 30 mL  30 mL Oral Q4H PRN Sharma Covert, MD      . folic acid (FOLVITE) tablet 1 mg  1 mg Oral Daily Sharma Covert, MD   1 mg at 09/02/19 [id number]  . hydrOXYzine (ATARAX/VISTARIL) tablet 25 mg  25 mg Oral TID PRN Sharma Covert, MD   25 mg at 08/31/19 1235  . magnesium hydroxide (MILK OF MAGNESIA) suspension 30 mL  30 mL Oral Daily PRN Sharma Covert, MD      . sertraline (ZOLOFT) tablet 25 mg  25 mg Oral Daily Sharma Covert, MD   25 mg at 09/02/19 0842  . thiamine tablet 100 mg  100 mg Oral Daily Sharma Covert, MD   100 mg at 09/02/19 [id number]  . traZODone (DESYREL) tablet 50 mg  50 mg Oral QHS PRN Sharma Covert, MD   50 mg at  09/01/19 2123   Lab Results:  Results for orders placed or performed during the hospital encounter of 08/31/19 (from the past 48 hour(s))  TSH     Status: None   Collection Time: 08/31/19  6:17 PM  Result Value Ref Range   TSH 1.512 0.350 - 4.500 uIU/mL    Comment: Performed by a 3rd Generation assay with a functional sensitivity of <=0.01 uIU/mL. Performed at [facility], [place] [address] [place], [place] [address]   hCG, quantitative, pregnancy     Status: None   Collection Time: 08/31/19  6:17 PM  Result Value Ref Range   hCG, Beta Chain, Quant, S <1 <5 mIU/mL  Comment:          GEST. AGE      CONC.  (mIU/mL)   <=1 WEEK        5 - 50     2 WEEKS       50 - 500     3 WEEKS       100 - 10,000     4 WEEKS     1,000 - 30,000     5 WEEKS     3,500 - 115,000   6-8 WEEKS     12,000 - 270,000    12 WEEKS     15,000 - 220,000        FEMALE AND NON-PREGNANT FEMALE:     LESS THAN 5 mIU/mL Performed at [facility], [place] [address] [place], [place] [address]    Blood Alcohol level:  Lab Results  Component Value Date   ETH <10 [id number]   Metabolic Disorder Labs: No results found for: HGBA1C, MPG No results found for: PROLACTIN No results found for: CHOL, TRIG, HDL, CHOLHDL, VLDL, LDLCALC  Physical Findings: AIMS: Facial and Oral Movements Muscles of Facial Expression: None, normal Lips and Perioral Area: None, normal Jaw: None, normal Tongue: None, normal,Extremity Movements Upper (arms, wrists, hands, fingers): None, normal Lower (legs, knees, ankles, toes): None, normal, Trunk Movements Neck, shoulders, hips: None, normal, Overall Severity Severity of abnormal movements (highest score from questions above): None, normal Incapacitation due to abnormal movements: None, normal Patient's awareness of abnormal movements (rate only patient's report): No Awareness, Dental Status Current problems with teeth and/or dentures?: No Does patient  usually wear dentures?: No  CIWA:  CIWA-Ar Total: 2 COWS:  COWS Total Score: 2  Musculoskeletal: Strength & Muscle Tone: within normal limits Gait & Station: normal Patient leans: N/A  Psychiatric Specialty Exam: Physical Exam  Vitals reviewed. Constitutional: She is oriented to person, place, and time. She appears well-developed and well-nourished. No distress.  Respiratory: Effort normal. No respiratory distress.  Genitourinary:    Genitourinary Comments: Deferred   Musculoskeletal:        General: Normal range of motion.  Neurological: She is alert and oriented to person, place, and time.  Skin: Skin is warm and dry. She is not diaphoretic.  Psychiatric: Her mood appears anxious. She is not withdrawn and not actively hallucinating. Thought content is not paranoid and not delusional. She exhibits a depressed mood. She expresses no homicidal and no suicidal ideation.    Review of Systems  Constitutional: Negative for activity change, appetite change, chills, diaphoresis, fatigue, fever and unexpected weight change.  HENT: Negative for congestion and sore throat.   Respiratory: Negative for cough and shortness of breath.   Cardiovascular: Negative for chest pain.  Gastrointestinal: Negative for diarrhea, nausea and vomiting.  Genitourinary: Negative for difficulty urinating.  Musculoskeletal: Negative.   Skin: Negative for color change.  Allergic/Immunologic:       Allergies: Latex.  Neurological: Negative for dizziness and headaches.  Psychiatric/Behavioral: Positive for dysphoric mood ("Improving"). Negative for hallucinations, self-injury, sleep disturbance and suicidal ideas. The patient is not nervous/anxious (Stable) and is not hyperactive.   All other systems reviewed and are negative.   Blood pressure 105/84, pulse (!) 111, temperature (!) 97.4 F (36.3 C), temperature source Oral, resp. rate 16, height 4' 9.5" (1.461 m), weight 71.2 kg, last menstrual period 08/12/2019,  SpO2 99 %, not currently breastfeeding.Body mass index is 33.39 kg/m.  General Appearance: Casual  Eye Contact:  Good  Speech:  Clear and Coherent and Normal Rate  Volume:  Normal  Mood:  Euthymic  Affect:  Congruent  Thought Process:  Coherent, Goal Directed and Descriptions of Associations: Intact  Orientation:  Full (Time, Place, and Person)  Thought Content:  Logical  Suicidal Thoughts:  Denies  Homicidal Thoughts:  No  Memory:  Immediate;   Good Recent;   Good Remote;   Good  Judgement:  Intact  Insight:  Fair  Psychomotor Activity:  Normal  Concentration:  Concentration: Good and Attention Span: Good  Recall:  Good  Fund of Knowledge:  Good  Language:  Good  Akathisia:  Negative  Handed:  Right  AIMS (if indicated):     Assets:  Communication Skills Desire for Improvement Housing Resilience Social Support  ADL's:  Intact  Cognition:  WNL  Sleep:  Number of Hours: 6.75   Treatment Plan Summary: Daily contact with patient to assess and evaluate symptoms and progress in treatment and Medication management.  - Continue inpatient hospitalization.  - Will continue today 09/02/2019 plan as below except where it is noted.  Depression     - Continue sertraline 25 mg daily for depression/anxiety.  Anxiety-        - Continue hydroxyzine every 6 hours prn for anxiety.  Insomnia.      - Continue trazodone 50 mg QHS prn for sleep.  - Continue thiamine 100 mg daily for nutritional supplementation - Continue folic acid 1 mg daily for nutritional supplementation/decreased MCV.  - Patient to participate in the group sessions. - Discharge disposition is ongoing.  Lindell Spar, NP, PMHNP, FNP-BC 09/02/2019, 1:18 PMPatient ID: Paige Compton, female   DOB: 1996-06-09, 24 y.o.   MRN: [id number]

## 2019-09-02 NOTE — Progress Notes (Addendum)
   09/02/19 1600  Psych Admission Type (Psych Patients Only)  Admission Status Voluntary  Psychosocial Assessment  Patient Complaints None  Eye Contact Fair  Facial Expression Flat  Affect Appropriate to circumstance  Speech Logical/coherent  Interaction Assertive  Motor Activity Slow  Appearance/Hygiene Unremarkable  Behavior Characteristics Cooperative;Appropriate to situation  Mood Pleasant  Aggressive Behavior  Effect No apparent injury  Thought Process  Coherency WDL  Content WDL  Delusions WDL  Perception WDL  Hallucination None reported or observed  Judgment WDL  Confusion WDL  Danger to Self  Current suicidal ideation? Denies  Danger to Others  Danger to Others None reported or observed   Pt has been calm and cooperative, friendly during interactions, visible in the dayroom interacting appropriately with peers and staff.. Per pt's self inventory, pt rated her depression, hopelessness and anxiety a 0/0/1, respectively. Pt writes that her goal today is "knowing my worth because I am important".

## 2019-09-02 NOTE — Progress Notes (Signed)
   09/02/19 2257  COVID-19 Daily Checkoff  Have you had a fever (temp > 37.80C/100F)  in the past 24 hours?  No  If you have had runny nose, nasal congestion, sneezing in the past 24 hours, has it worsened? No  COVID-19 EXPOSURE  Have you traveled outside the state in the past 14 days? No  Have you been in contact with someone with a confirmed diagnosis of COVID-19 or PUI in the past 14 days without wearing appropriate PPE? No  Have you been living in the same home as a person with confirmed diagnosis of COVID-19 or a PUI (household contact)? No  Have you been diagnosed with COVID-19? No

## 2019-09-03 DIAGNOSIS — F411 Generalized anxiety disorder: Secondary | ICD-10-CM

## 2019-09-03 MED ORDER — SERTRALINE HCL 25 MG PO TABS: 25.0000 mg | ORAL_TABLET | Freq: Every day | ORAL | 0 refills | Status: DC

## 2019-09-03 MED ORDER — TRAZODONE HCL 50 MG PO TABS: 50.0000 mg | ORAL_TABLET | Freq: Every evening | ORAL | 0 refills | Status: DC | PRN

## 2019-09-03 MED ORDER — HYDROXYZINE HCL 25 MG PO TABS: 25.0000 mg | ORAL_TABLET | Freq: Three times a day (TID) | ORAL | 0 refills | Status: DC | PRN

## 2019-09-03 NOTE — Progress Notes (Signed)
D:  Patient denied SI and HI, contracts for safety.  Denied A/V hallucinations.  Denied pain. A:  Medications administered per MD orders.  Emotional support and encouragement given patient. R:  Safety maintained with 15 minute checks.  

## 2019-09-03 NOTE — Progress Notes (Signed)
Pt discharged to lobby. Pt was stable and appreciative at that time. All papers and prescriptions were given. Verbal understanding expressed. Denies SI/HI and A/VH. Pt given opportunity to express concerns and ask questions.

## 2019-09-03 NOTE — [facility] Group Notes (Signed)
[facility] LCSW Group Therapy Note  09/03/2019    Type of Therapy and Topic:  Group Therapy:  Adding Supports Including Yourself  Participation Level:  Active   Description of Group:   Patients in this group were introduced to the concept that additional supports including self-support are an essential part of recovery.  Patients listed what supports they believe they need to add to their lives to achieve their goals at discharge, and they listed such things as therapist, family, doctor, support groups, 12-step groups and service animals.   A song entitled "My Own Hero" was played and a group discussion ensued in which patients stated they could relate to the song and it inspired them to realize they have be willing to help themselves in order to succeed, because other people cannot achieve sobriety or stability for them.  "Fight For It" was played, then "I Am Enough" to encourage patients.  They discussed the impact on them and how they must remain convinced that their lives are worth the effort it takes to become sober and/or stable.  Therapeutic Goals: 1)  demonstrate the importance of being a key part of one's own support system 2)  discuss various available supports 3)  encourage patient to use music as part of their self-support and focus on goals 4)  elicit ideas from patients about supports that need to be added   Summary of Patient Progress:  The patient expressed that she needs better emotional support from her mother and daughter's father, and she needs to work on her own work-life balance.  She was frustrated at the suggestions for how to establish boundaries with people in her life, raised multiple objections as to why those suggestions will not work.    Therapeutic Modalities:   Motivational Interviewing Activity  [place] Hun Grossman-Orr  8:52 AM

## 2019-09-03 NOTE — Discharge Summary (Signed)
Physician Discharge Summary Note  Patient:  Paige Compton is an 24 y.o., female MRN:  [id number] DOB:  Feb 11, 1996 Patient phone:  [phone] (home)  Patient address:   [address] [place] [address],  Total Time spent with patient: Greater than 30 minutes  Date of Admission:  08/31/2019  Date of Discharge: 09-03-19  Reason for Admission: Worsening psot-partum depression exacerbated by stress.  Principal Problem: Major depressive disorder, recurrent ([place])  Discharge Diagnoses: Principal Problem:   Major depressive disorder, recurrent ([place]) Active Problems:   Depression  Past Psychiatric History: Major depressive disorder.  Past Medical History:  Past Medical History:  Diagnosis Date  . Anxiety   . Depression   . Medical history non-contributory     Past Surgical History:  Procedure Laterality Date  . NO PAST SURGERIES     Family History:  Family History  Problem Relation Age of Onset  . Cancer Maternal Aunt        breast  . Leukemia Maternal Uncle   . Cancer Paternal Uncle        pancreatic  . Hypertension Maternal Grandmother   . Healthy Mother   . Healthy Father    Family Psychiatric  History: See H&P.  Social History:  Social History   Substance and Sexual Activity  Alcohol Use Not Currently     Social History   Substance and Sexual Activity  Drug Use Not Currently    Social History   Socioeconomic History  . Marital status: Single    Spouse name: Not on file  . Number of children: Not on file  . Years of education: Not on file  . Highest education level: Not on file  Occupational History  . Not on file  Tobacco Use  . Smoking status: Never Smoker  . Smokeless tobacco: Never Used  Substance and Sexual Activity  . Alcohol use: Not Currently  . Drug use: Not Currently  . Sexual activity: Yes    Birth control/protection: None  Other Topics Concern  . Not on file  Social History Narrative  . Not on file   Social  Determinants of Health   Financial Resource Strain:   . Difficulty of Paying Living Expenses:   Food Insecurity:   . Worried About Charity fundraiser in the Last Year:   . Arboriculturist in the Last Year:   Transportation Needs:   . Film/video editor (Medical):   Marland Kitchen Lack of Transportation (Non-Medical):   Physical Activity:   . Days of Exercise per Week:   . Minutes of Exercise per Session:   Stress:   . Feeling of Stress :   Social Connections:   . Frequency of Communication with Friends and Family:   . Frequency of Social Gatherings with Friends and Family:   . Attends Religious Services:   . Active Member of Clubs or Organizations:   . Attends Archivist Meetings:   Marland Kitchen Marital Status:    Hospital Course: (Per Md's admission evaluation notes): Patient is a 24 year old female with a reported past psychiatric history significant for depression and anxiety who presented to the [facility] emergency department on 08/30/2019 with suicidal ideation. The patient stated that she delivered a healthy child in December 2019. Since then she been under significant stressors. She stated that after the birth of the child the biological father decided that he did not want to be in a relationship with the patient, and he began sleeping in another room.  The child slept in the room with her. She was seen in her postpartum visit, and there was a suggestion of some postpartum depression, but no medications were started at that time. Over the last year the biological father the child has not been helpful, and the patient stated that her family wants her to work at a job and also to be able to care for the child. She stated sometimes when she gets frustrated and upset she will think about driving her car in a wall. She worries at times that she might accidentally harm the child. She has no intent of harming her child. She denied any previous psychiatric admissions. She stated she had cut  herself in the past and the last time was at age 55. She denied any drugs or alcohol. She is currently working at [employer], and has in the past worked a Clinical cytogeneticist-. She has a significant family history of suspected depression and 2 siblings who have been admitted to our facility in the past. She admitted to increased anxiety, stress, feeling helpless, hopeless and worthless. She was admitted to the hospital for evaluation and stabilization.  Tamiki was admitted to the [facility] hospital for worsening symptoms of post-partum depression & crisis management due to the fact that she was entertaining fear of hurting her new baby. She was brought to the hospital for evaluation & treatment.   After her admission assessment as noted above, Crystall's presenting symptoms were identified. The medication regimen targeting those symptoms were discussed & initiated with her consent. She was medicated, stabilized & discharged on the medications as listed below on her discharge medication lists. She presented no other pre-existing medical problems  That required treatment. She tolerated her treatment regimen without any adverse effects or reactions reported. Zauria was enrolled & participated in the group counseling sessions being offered & held on this unit. She learned coping skills.  During the course of her hospitalization, the 15-minute checks were adequate to ensure Tajae's safety. Patient did not display any dangerous, violent or suicidal behavior on the unit.  She interacted with patients & staff appropriately, participated appropriately in the group sessions/therapies. Her medications were addressed & adjusted to meet her needs. She was recommended for outpatient follow-up care & medication management upon discharge to assure her continuity of care.  At the time of discharge patient is not reporting any acute suicidal/homicidal ideations. She feels more confident about her self & mental health care. She currently  denies any new issues or concerns. Education and supportive counseling provided throughout her hospital stay & upon discharge.   Today upon her discharge evaluation with the attending psychiatrist, Luxe  shares she is doing well. She denies any other specific concerns. She is sleeping well. Her appetite is good. She denies other physical complaints. She denies AH/VH. She feels that her medications have been helpful & is in agreement to continue her current treatment regimen as recommended. She was able to engage in safety planning including plan to return to [facility] or contact emergency services if she feels unable to maintain her own safety or the safety of others. Pt had no further questions, comments, or concerns. She left [facility] with all personal belongings in no apparent distress. Transportation per her patient's arrangement.  Physical Findings: AIMS: Facial and Oral Movements Muscles of Facial Expression: None, normal Lips and Perioral Area: None, normal Jaw: None, normal Tongue: None, normal,Extremity Movements Upper (arms, wrists, hands, fingers): None, normal Lower (legs, knees, ankles, toes): None, normal, Trunk Movements  Neck, shoulders, hips: None, normal, Overall Severity Severity of abnormal movements (highest score from questions above): None, normal Incapacitation due to abnormal movements: None, normal Patient's awareness of abnormal movements (rate only patient's report): No Awareness, Dental Status Current problems with teeth and/or dentures?: No Does patient usually wear dentures?: No  CIWA:  CIWA-Ar Total: 2 COWS:  COWS Total Score: 2  Musculoskeletal: Strength & Muscle Tone: within normal limits Gait & Station: normal Patient leans: N/A  Psychiatric Specialty Exam: Physical Exam  Nursing note and vitals reviewed. Constitutional: She is oriented to person, place, and time. She appears well-developed.  Cardiovascular: Normal rate.  Respiratory: No respiratory distress.  She has no wheezes. She has no rales.  GI: She exhibits no distension.  Genitourinary:    Genitourinary Comments: Deferred   Musculoskeletal:        General: Normal range of motion.     Cervical back: Normal range of motion.  Neurological: She is alert and oriented to person, place, and time.  Skin: Skin is warm and dry.    Review of Systems  Constitutional: Negative for chills, diaphoresis and fever.  HENT: Negative for congestion, rhinorrhea, sneezing and sore throat.   Respiratory: Negative for cough and shortness of breath.   Cardiovascular: Negative for chest pain and palpitations.  Gastrointestinal: Negative for diarrhea, nausea and vomiting.  Genitourinary: Negative for difficulty urinating.  Musculoskeletal: Negative for myalgias.  Skin: Negative.   Allergic/Immunologic: Negative for food allergies.       Allergies: Latex  Neurological: Negative for dizziness, syncope, weakness and light-headedness.  Psychiatric/Behavioral: Positive for dysphoric mood (Stabilized with medication prior to discharge) and sleep disturbance (Stabilized with medication prior to discharge). Negative for agitation, behavioral problems, confusion, decreased concentration, hallucinations, self-injury and suicidal ideas. The patient is not nervous/anxious (Stable) and is not hyperactive.     Blood pressure 93/64, pulse 83, temperature (!) 97.4 F (36.3 C), temperature source Oral, resp. rate 16, height 4' 9.5" (1.461 m), weight 71.2 kg, last menstrual period 08/12/2019, SpO2 99 %, not currently breastfeeding.Body mass index is 33.39 kg/m.  See Md's discharge SRA  Sleep:  Number of Hours: 4.5   Have you used any form of tobacco in the last 30 days? (Cigarettes, Smokeless Tobacco, Cigars, and/or Pipes): No  Has this patient used any form of tobacco in the last 30 days? (Cigarettes, Smokeless Tobacco, Cigars, and/or Pipes): N/A  Blood Alcohol level:  Lab Results  Component Value Date   ETH <10  123456   Metabolic Disorder Labs:  No results found for: HGBA1C, MPG No results found for: PROLACTIN No results found for: CHOL, TRIG, HDL, CHOLHDL, VLDL, LDLCALC  See Psychiatric Specialty Exam and Suicide Risk Assessment completed by Attending Physician prior to discharge.  Discharge destination:  Home  Is patient on multiple antipsychotic therapies at discharge:  No   Has Patient had three or more failed trials of antipsychotic monotherapy by history:  No  Recommended Plan for Multiple Antipsychotic Therapies: NA   Allergies as of 09/03/2019      Reactions   Latex Swelling      Medication List    TAKE these medications     Indication  hydrOXYzine 25 MG tablet Commonly known as: ATARAX/VISTARIL Take 1 tablet (25 mg total) by mouth 3 (three) times daily as needed for anxiety.  Indication: Feeling Anxious   sertraline 25 MG tablet Commonly known as: ZOLOFT Take 1 tablet (25 mg total) by mouth daily. For depression Start taking on: September 04, 2019  Indication: Major Depressive Disorder   traZODone 50 MG tablet Commonly known as: DESYREL Take 1 tablet (50 mg total) by mouth at bedtime as needed for sleep.  Indication: Trouble Sleeping      Follow-up Information    Services, [facility]. Go on 09/08/2019.   Why: You are scheduled for an appointment on 09/08/19 at 8:00 am.  This appointment will be held in person.  Contact information: [place] [address] [phone]          Follow-up recommendations: Activity:  As tolerated Diet: As recommended by your primary care doctor. Keep all scheduled follow-up appointments as recommended.   Comments: Prescriptions given at discharge.  Patient agreeable to plan.  Given opportunity to ask questions.  Appears to feel comfortable with discharge denies any current suicidal or homicidal thought. Patient is also instructed prior to discharge to: Take all medications as prescribed by his/her mental  healthcare provider. Report any adverse effects and or reactions from the medicines to his/her outpatient provider promptly. Patient has been instructed & cautioned: To not engage in alcohol and or illegal drug use while on prescription medicines. In the event of worsening symptoms, patient is instructed to call the crisis hotline, 911 and or go to the nearest ED for appropriate evaluation and treatment of symptoms. To follow-up with his/her primary care provider for your other medical issues, concerns and or health care needs.  Signed: Lindell Spar, NP, PMHNP, FNP-BC 09/03/2019, 10:07 AM

## 2019-09-03 NOTE — BHH Suicide Risk Assessment (Signed)
[facility] Discharge Suicide Risk Assessment   Principal Problem: Major depressive disorder, recurrent ([place]) Discharge Diagnoses: Principal Problem:   Major depressive disorder, recurrent ([place]) Active Problems:   Depression   Total Time spent with patient: 15 minutes  Musculoskeletal: Strength & Muscle Tone: within normal limits Gait & Station: normal Patient leans: N/A  Psychiatric Specialty Exam: Review of Systems  All other systems reviewed and are negative.   Blood pressure 93/64, pulse 83, temperature (!) 97.4 F (36.3 C), temperature source Oral, resp. rate 16, height 4' 9.5" (1.461 m), weight 71.2 kg, last menstrual period 08/12/2019, SpO2 99 %, not currently breastfeeding.Body mass index is 33.39 kg/m.  General Appearance: Casual  Eye Contact::  Good  Speech:  Normal Rate409  Volume:  Normal  Mood:  Euthymic  Affect:  Congruent  Thought Process:  Coherent and Descriptions of Associations: Intact  Orientation:  Full (Time, Place, and Person)  Thought Content:  Logical  Suicidal Thoughts:  No  Homicidal Thoughts:  No  Memory:  Immediate;   Good Recent;   Good Remote;   Good  Judgement:  Intact  Insight:  Good  Psychomotor Activity:  Normal  Concentration:  Good  Recall:  Good  Fund of Knowledge:Good  Language: Good  Akathisia:  Negative  Handed:  Right  AIMS (if indicated):     Assets:  Communication Skills Desire for Improvement Housing Resilience Social Support Talents/Skills  Sleep:  Number of Hours: 4.5  Cognition: WNL  ADL's:  Intact   Mental Status Per Nursing Assessment::   On Admission:  NA  Demographic Factors:  Low socioeconomic status  Loss Factors: Financial problems/change in socioeconomic status  Historical Factors: Impulsivity  Risk Reduction Factors:   Responsible for children under 86 years of age, Sense of responsibility to family and Living with another person, especially a relative  Continued Clinical Symptoms:  Depression:    Impulsivity  Cognitive Features That Contribute To Risk:  None    Suicide Risk:  Minimal: No identifiable suicidal ideation.  Patients presenting with no risk factors but with morbid ruminations; may be classified as minimal risk based on the severity of the depressive symptoms  Follow-up Information    [facility]. Go on 09/08/2019.   Why: You are scheduled for an appointment on 09/08/19 at 8:00 am.  This appointment will be held in person.  Contact information: [place] [address] [phone]           Plan Of Care/Follow-up recommendations:  Activity:  ad lib  Sharma Covert, MD 09/03/2019, 9:32 AM

## 2019-09-13 ENCOUNTER — Encounter (HOSPITAL_COMMUNITY): Payer: Self-pay

## 2019-09-13 DIAGNOSIS — F411 Generalized anxiety disorder: Secondary | ICD-10-CM

## 2019-09-13 NOTE — BH Assessment (Signed)
Q-Actual   Writer left a voice mail message on 09-04-2019 and 09-12-2019.  There are no wellness checks listed on the clinical dashboard.

## 2019-09-23 ENCOUNTER — Encounter (HOSPITAL_COMMUNITY): Payer: Self-pay

## 2019-09-23 DIAGNOSIS — F411 Generalized anxiety disorder: Secondary | ICD-10-CM

## 2019-09-26 NOTE — BH Assessment (Signed)
Q-Actual   PHQ 2 is 0 PHQ 9 is 4   Writer resent the invitation to the participant and assisted her in enrolling into the app.

## 2019-10-08 ENCOUNTER — Encounter (HOSPITAL_COMMUNITY): Payer: Self-pay | Admitting: Emergency Medicine

## 2019-10-08 ENCOUNTER — Emergency Department (HOSPITAL_COMMUNITY)
Admission: EM | Admit: 2019-10-08 | Discharge: 2019-10-08 | Disposition: A | Discharge status: Home / Self Care | Payer: Medicaid Other | Attending: Emergency Medicine | Admitting: Emergency Medicine

## 2019-10-08 ENCOUNTER — Other Ambulatory Visit: Payer: Self-pay

## 2019-10-08 DIAGNOSIS — M791 Myalgia, unspecified site: Secondary | ICD-10-CM | POA: Insufficient documentation

## 2019-10-08 DIAGNOSIS — Z91040 Latex allergy status: Secondary | ICD-10-CM | POA: Insufficient documentation

## 2019-10-08 DIAGNOSIS — N644 Mastodynia: Secondary | ICD-10-CM | POA: Diagnosis present

## 2019-10-08 MED ORDER — METHOCARBAMOL 500 MG PO TABS: 500.0000 mg | ORAL_TABLET | Freq: Four times a day (QID) | ORAL | 0 refills | Status: DC

## 2019-10-08 MED ORDER — DICLOFENAC SODIUM 75 MG PO TBEC: 75.0000 mg | DELAYED_RELEASE_TABLET | Freq: Two times a day (BID) | ORAL | 0 refills | Status: DC

## 2019-10-08 NOTE — ED Provider Notes (Signed)
[facility] EMERGENCY DEPARTMENT Provider Note   CSN: [id number] Arrival date & time: 10/08/19  1020     History Chief Complaint  Patient presents with  . Breast Pain    Paige Compton is a 24 y.o. female.  Pt complains of soreness right breast.  Pt reports she has had soreness from her nipple to her right upper arm.  Pt reports she had an ultrasound which was normal. Pt has a 54 year old child that she lifts.  Pt is right handed.  No fever, no chills   The history is provided by the patient. No language interpreter was used.       Past Medical History:  Diagnosis Date  . Anxiety   . Depression   . Medical history non-contributory     Patient Active Problem List   Diagnosis Date Noted  . Generalized anxiety disorder   . Major depressive disorder, recurrent ([place]) 09/01/2019  . Depression 08/31/2019  . Rh negative state in antepartum period 06/02/2018  . LGSIL on Pap smear of cervix 06/02/2018  . Supervision of normal first pregnancy 05/23/2018    Past Surgical History:  Procedure Laterality Date  . NO PAST SURGERIES       OB History    Gravida  1   Para      Term      Preterm      AB      Living  1     SAB      TAB      Ectopic      Multiple      Live Births              Family History  Problem Relation Age of Onset  . Cancer Maternal Aunt        breast  . Leukemia Maternal Uncle   . Cancer Paternal Uncle        pancreatic  . Hypertension Maternal Grandmother   . Healthy Mother   . Healthy Father     Social History   Tobacco Use  . Smoking status: Never Smoker  . Smokeless tobacco: Never Used  Substance Use Topics  . Alcohol use: Not Currently  . Drug use: Not Currently    Home Medications Prior to Admission medications   Medication Sig Start Date End Date Taking? Authorizing Provider  diclofenac (VOLTAREN) 75 MG EC tablet Take 1 tablet (75 mg total) by mouth 2 (two) times daily. 10/08/19   Fransico Meadow, PA-C    hydrOXYzine (ATARAX/VISTARIL) 25 MG tablet Take 1 tablet (25 mg total) by mouth 3 (three) times daily as needed for anxiety. 09/03/19   Lindell Spar I, NP  methocarbamol (ROBAXIN) 500 MG tablet Take 1 tablet (500 mg total) by mouth 4 (four) times daily. 10/08/19   Fransico Meadow, PA-C  sertraline (ZOLOFT) 25 MG tablet Take 1 tablet (25 mg total) by mouth daily. For depression 09/04/19   Lindell Spar I, NP  traZODone (DESYREL) 50 MG tablet Take 1 tablet (50 mg total) by mouth at bedtime as needed for sleep. 09/03/19   Encarnacion Slates, NP    Allergies    Latex  Review of Systems   Review of Systems  All other systems reviewed and are negative.   Physical Exam Updated Vital Signs BP 111/79 (BP Location: Right Arm)   Pulse 79   Temp 98.1 F (36.7 C) (Oral)   Resp 14   Ht 4\' 9"  (1.448 m)  Wt 70.3 kg   SpO2 99%   BMI 33.54 kg/m   Physical Exam Vitals and nursing note reviewed.  Constitutional:      Appearance: She is well-developed.  HENT:     Head: Normocephalic.     Mouth/Throat:     Mouth: Mucous membranes are moist.  Eyes:     Pupils: Pupils are equal, round, and reactive to light.  Cardiovascular:     Rate and Rhythm: Normal rate and regular rhythm.  Pulmonary:     Effort: Pulmonary effort is normal.  Abdominal:     General: There is no distension.  Musculoskeletal:        General: Normal range of motion.     Cervical back: Normal range of motion.     Comments: Tender right upper arm, tender right chest wall and upper breast tissue.  No mass.   Skin:    General: Skin is warm.  Neurological:     General: No focal deficit present.     Mental Status: She is alert and oriented to person, place, and time.  Psychiatric:        Mood and Affect: Mood normal.     ED Results / Procedures / Treatments   Labs (all labs ordered are listed, but only abnormal results are displayed) Labs Reviewed - No data to display  EKG None  Radiology No results  found.  Procedures Procedures (including critical care time)  Medications Ordered in ED Medications - No data to display  ED Course  I have reviewed the triage vital signs and the nursing notes.  Pertinent labs & imaging results that were available during my care of the patient were reviewed by me and considered in my medical decision making (see chart for details).    MDM Rules/Calculators/A&P                      MDM:  I suspect muscular pain.  I will try treating with voltaren and robaxin.  Pt advised to see her MD for recheck in 1 week. Final Clinical Impression(s) / ED Diagnoses Final diagnoses:  Muscle pain    Rx / DC Orders ED Discharge Orders         Ordered    diclofenac (VOLTAREN) 75 MG EC tablet  2 times daily     10/08/19 1056    methocarbamol (ROBAXIN) 500 MG tablet  4 times daily     10/08/19 1056        An After Visit Summary was printed and given to the patient.    Fransico Meadow, [place] 10/08/19 1427    Veryl Speak, MD 10/08/19 1454

## 2019-10-08 NOTE — ED Triage Notes (Signed)
RT breast pain and under right arm x 2 months.  Has had ultrasound that was negative.  Ran out of pain medication

## 2019-10-08 NOTE — Discharge Instructions (Signed)
Return if any problems.

## 2019-10-11 ENCOUNTER — Encounter (HOSPITAL_COMMUNITY): Payer: Self-pay

## 2019-10-11 DIAGNOSIS — F411 Generalized anxiety disorder: Secondary | ICD-10-CM

## 2019-10-11 NOTE — BH Assessment (Signed)
Q-actual 2-wk PHQ 2-9   PHQ 2 is 0  PHQ 9 is 1  Participant reports that she has forgot to comple the daily Wellness Checks.  The last time that she completed a daily wellness check was on 09-28-2019.

## 2019-10-30 ENCOUNTER — Encounter (HOSPITAL_COMMUNITY): Payer: Self-pay

## 2019-10-30 DIAGNOSIS — F411 Generalized anxiety disorder: Secondary | ICD-10-CM

## 2019-10-30 NOTE — BH Assessment (Signed)
Q-actual 2-wk PHQ 2-9   PHQ 2 is 0  PHQ 9 is 1  Participant reports that she has been forgetting to complete the daily Wellness Checks because she has not had any issues.    Writer encouraged participant to complete the daily Wellness checks daily.

## 2019-12-21 ENCOUNTER — Other Ambulatory Visit: Payer: Self-pay | Admitting: Orthopedic Surgery

## 2019-12-21 DIAGNOSIS — S62316P Displaced fracture of base of fifth metacarpal bone, right hand, subsequent encounter for fracture with malunion: Secondary | ICD-10-CM

## 2019-12-23 ENCOUNTER — Encounter (HOSPITAL_COMMUNITY): Payer: Self-pay

## 2019-12-23 DIAGNOSIS — F411 Generalized anxiety disorder: Secondary | ICD-10-CM

## 2019-12-25 NOTE — BH Assessment (Signed)
Q-actual 2-wk PHQ 2-9   PHQ 2 is 0  PHQ 9 is 0   

## 2020-01-04 ENCOUNTER — Ambulatory Visit
Admission: RE | Admit: 2020-01-04 | Discharge: 2020-01-04 | Disposition: A | Discharge status: Home / Self Care | Payer: Medicaid Other | Source: Ambulatory Visit | Attending: Orthopedic Surgery | Admitting: Orthopedic Surgery

## 2020-01-04 DIAGNOSIS — S62316P Displaced fracture of base of fifth metacarpal bone, right hand, subsequent encounter for fracture with malunion: Secondary | ICD-10-CM

## 2020-02-06 ENCOUNTER — Encounter (HOSPITAL_COMMUNITY): Payer: Self-pay | Admitting: General Practice

## 2020-02-06 DIAGNOSIS — F411 Generalized anxiety disorder: Secondary | ICD-10-CM

## 2020-02-06 NOTE — BH Assessment (Signed)
Q-actual 2-wk PHQ 2-9   PHQ 2 is 0  PHQ 9 is 0

## 2020-09-12 IMAGING — CT CT WRIST*R* W/O CM
3 of 6 series · 7 of 36 positions shown, 9 images · non-contrast
Comparison: None.

CLINICAL DATA: Chronic fifth metacarpal fracture with malunion.
Punched a wall at age 16.

EXAM:
CT OF THE RIGHT WRIST WITHOUT CONTRAST
TECHNIQUE: Multidetector CT imaging of the right wrist was performed according
to the standard protocol. Multiplanar CT image reconstructions were
also generated.

[Series 3: wrist 1.50 br60 s3 axial bone hd fov · axial · 0.15mm/px · z∈[-874,-742]mm · 5 of 253 slices shown, 7 images]
[im 43/253  soft-tissue]
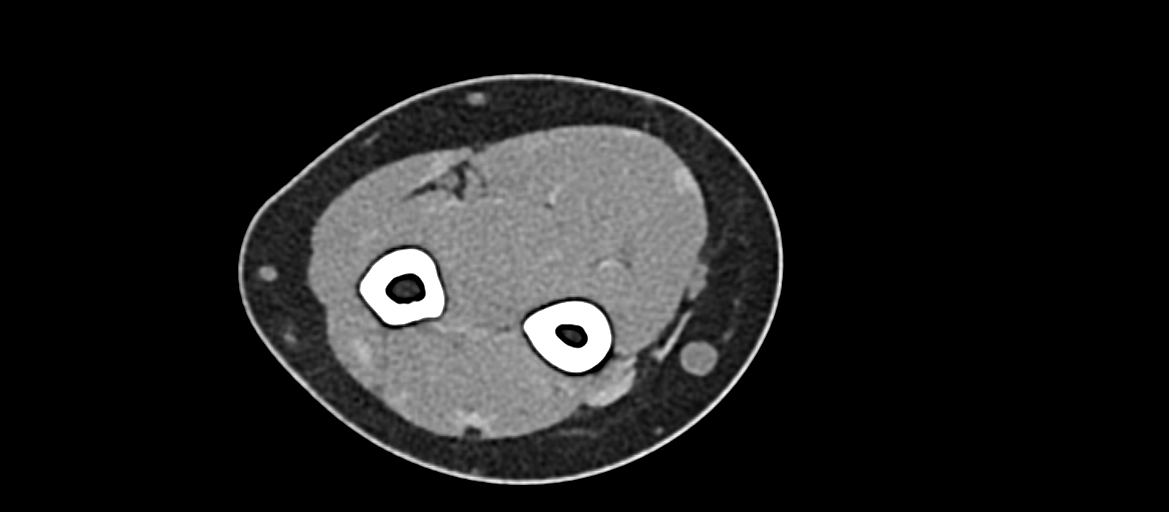
[im 43/253  bone]
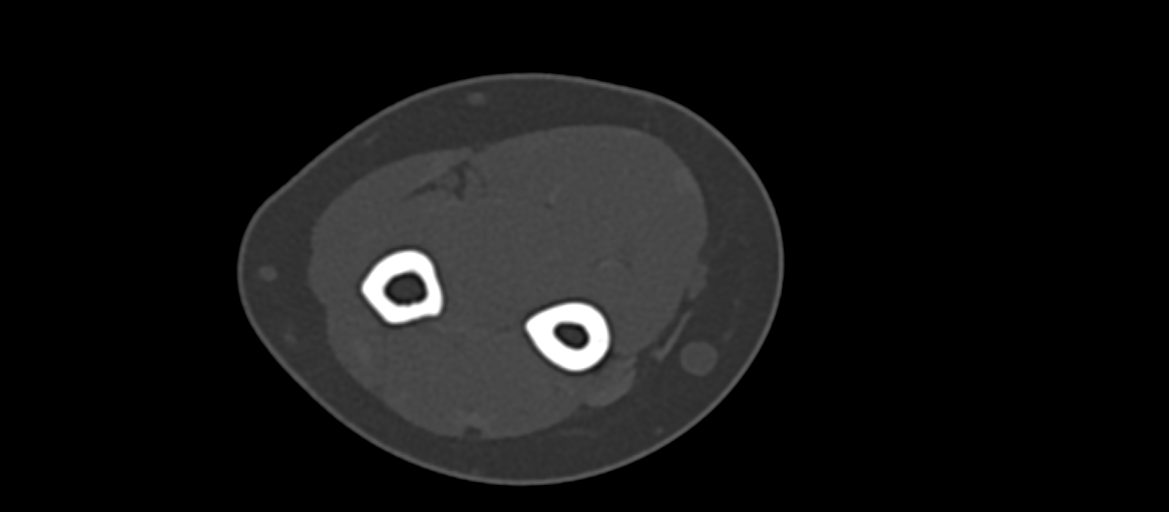
[im 85/253  bone]
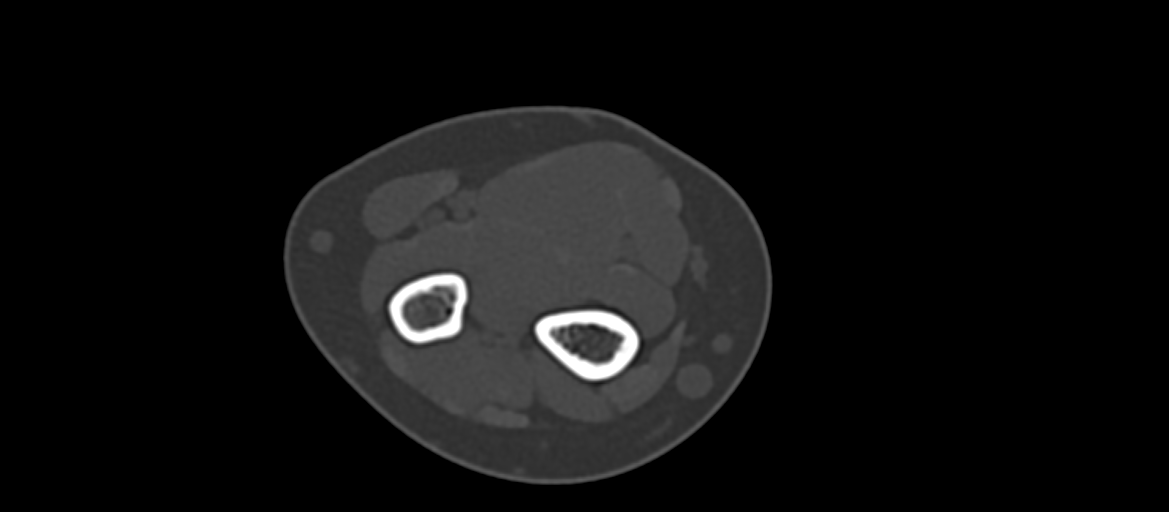
[im 127/253  bone]
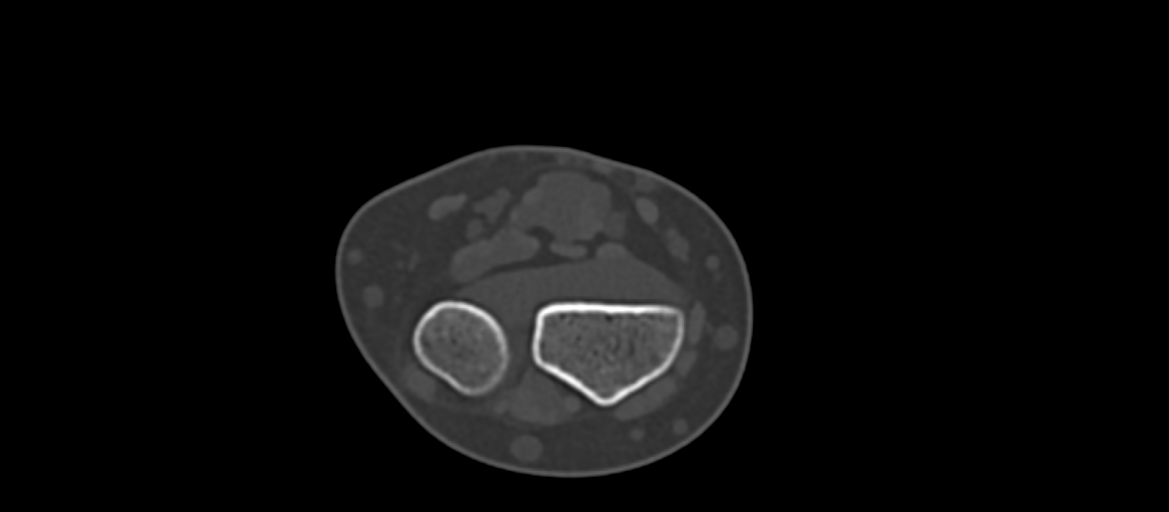
[im 169/253  bone]
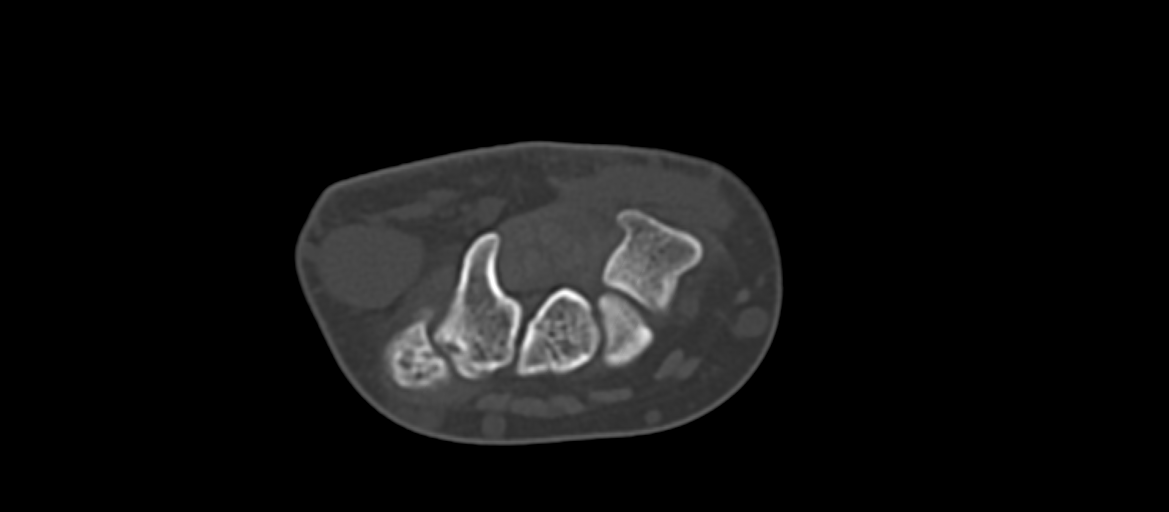
[im 211/253  soft-tissue]
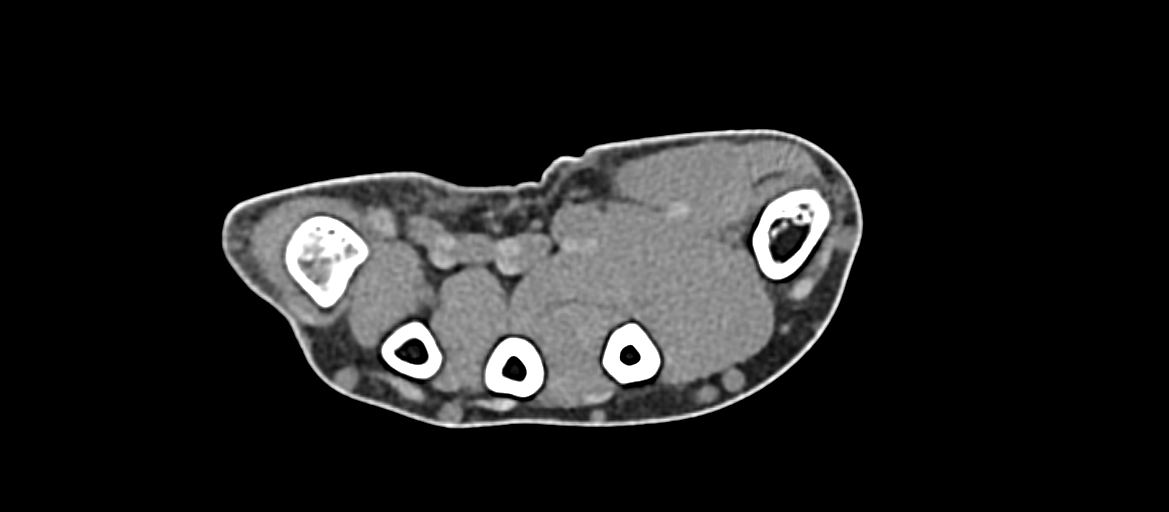
[im 211/253  bone]
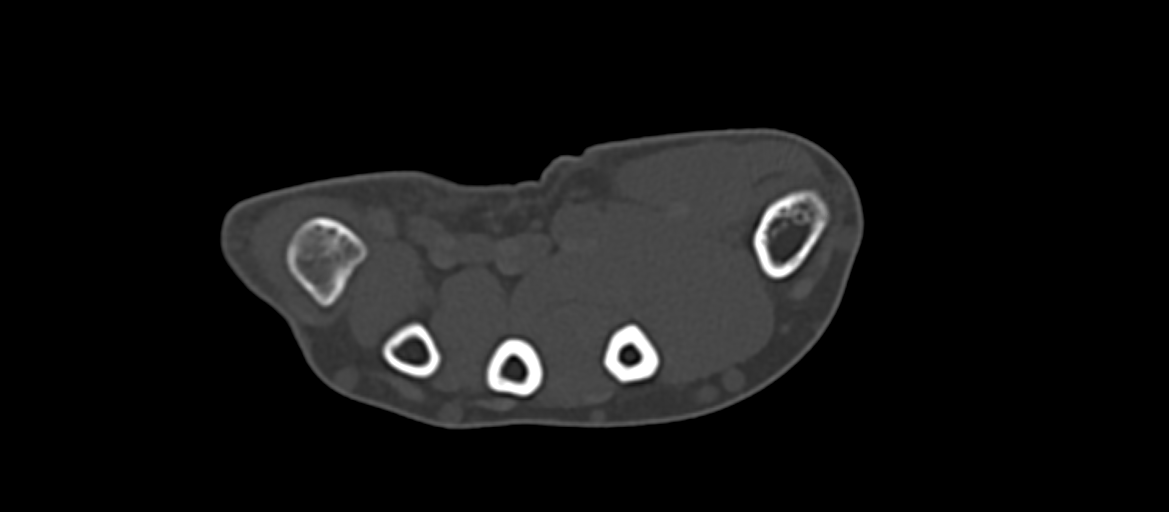

[Series 5: wrist 1.50 br40 s3 axial st hd fov · axial · 0.15mm/px · 1 of 253 slices shown]
[im 43/253  bone]
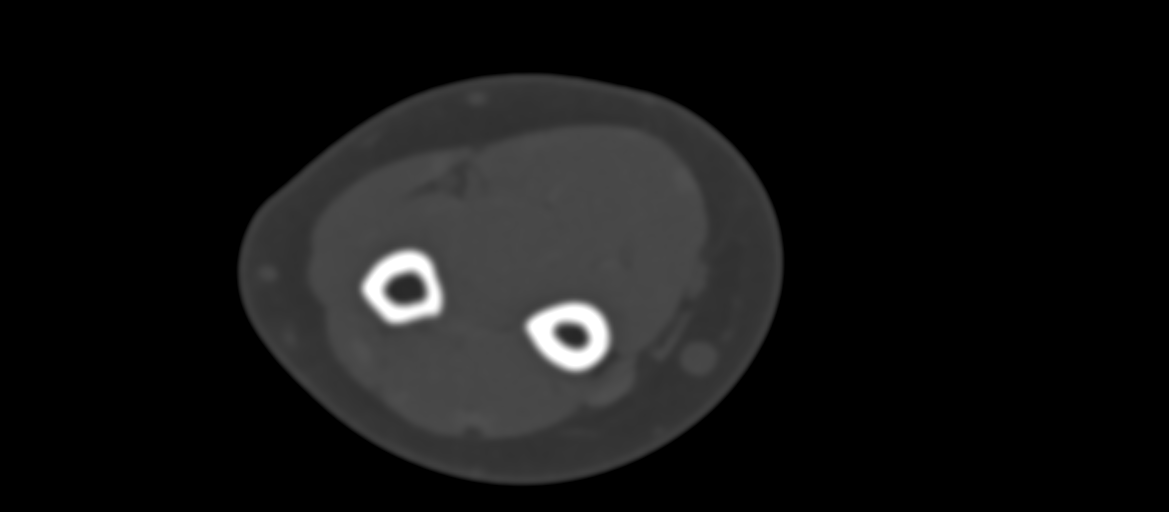

[Series 9: wrist 1.50 br60 s3 cor bone hd fov · coronal · 0.18mm/px · 1 of 94 slices shown]
[im 47/94  bone]
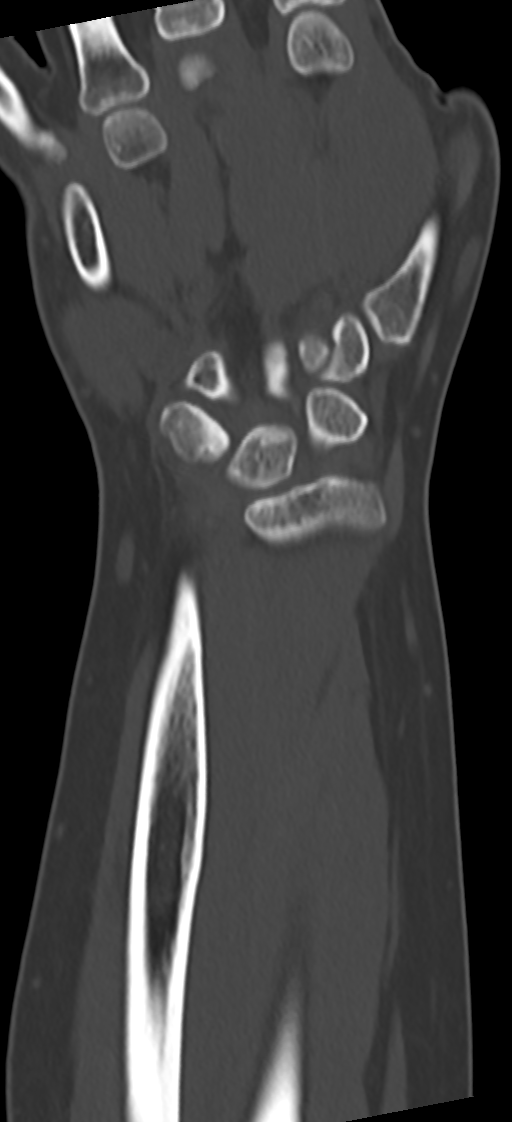

[7 of 36 positions shown; findings below may reference images not displayed]

FINDINGS: Bones/Joint/Cartilage

Healed chronic fracture deformities at the bases of the fourth and
fifth metacarpals. Small well corticated ossific fragment at the
dorsal tip of fifth metacarpal base. Mild post-traumatic
osteoarthritis of the fourth and fifth CMC joints. No acute fracture
or dislocation. Remaining joint spaces are preserved. No joint
effusion. Mild negative ulnar variance.

Ligaments

Ligaments are suboptimally evaluated by CT.

Muscles and Tendons
Flexor and extensor tendons are grossly intact.  No muscle atrophy.

Soft tissue
No fluid collection or hematoma.  No soft tissue mass.
IMPRESSION: 1. Healed chronic fracture deformities at the bases of the fourth
and fifth metacarpals.
2. Mild post-traumatic osteoarthritis of the fourth and fifth CMC
joints.

## 2020-10-29 ENCOUNTER — Encounter (HOSPITAL_COMMUNITY): Payer: Self-pay | Admitting: Emergency Medicine

## 2020-10-29 ENCOUNTER — Emergency Department (HOSPITAL_COMMUNITY)
Admission: EM | Admit: 2020-10-29 | Discharge: 2020-10-29 | Disposition: A | Discharge status: Home / Self Care | Payer: Medicaid Other | Attending: Emergency Medicine | Admitting: Emergency Medicine

## 2020-10-29 ENCOUNTER — Other Ambulatory Visit: Payer: Self-pay

## 2020-10-29 DIAGNOSIS — Z91040 Latex allergy status: Secondary | ICD-10-CM | POA: Diagnosis not present

## 2020-10-29 DIAGNOSIS — R519 Headache, unspecified: Secondary | ICD-10-CM

## 2020-10-29 MED ORDER — METOCLOPRAMIDE HCL 5 MG/ML IJ SOLN
10.0000 mg | Freq: Once | INTRAMUSCULAR | Status: AC
  Administered 2020-10-29: 10 mg via INTRAMUSCULAR
  Filled 2020-10-29: qty 2

## 2020-10-29 MED ORDER — KETOROLAC TROMETHAMINE 60 MG/2ML IM SOLN
60.0000 mg | Freq: Once | INTRAMUSCULAR | Status: AC
  Administered 2020-10-29: 60 mg via INTRAMUSCULAR
  Filled 2020-10-29: qty 2

## 2020-10-29 NOTE — ED Provider Notes (Addendum)
[facility] EMERGENCY DEPARTMENT Provider Note   CSN: [id number] Arrival date & time: 10/29/20  2019     History Chief Complaint  Patient presents with  . Headache    Paige Compton is a 25 y.o. female.  HPI    25 year old female comes in a chief complaint of headache. Headache started yesterday.  She has history of similar headaches.  Reports that the headaches typically start after she has had something salty to eat.  She had noodles to eat yesterday and also something salty today.  Her headaches are left-sided, constant, throbbing, sharp without any photophobia.  Patient does have some visual deficits/blurry vision associated with it.  The headaches are similar to her previous chronic headaches, last episode was 3 months ago. No new numbness/tingling/weakness.  Past Medical History:  Diagnosis Date  . Anxiety   . Depression   . Medical history non-contributory     Patient Active Problem List   Diagnosis Date Noted  . Generalized anxiety disorder   . Major depressive disorder, recurrent ([place]) 09/01/2019  . Depression 08/31/2019  . Rh negative state in antepartum period 06/02/2018  . LGSIL on Pap smear of cervix 06/02/2018  . Supervision of normal first pregnancy 05/23/2018    Past Surgical History:  Procedure Laterality Date  . NO PAST SURGERIES       OB History    Gravida  1   Para      Term      Preterm      AB      Living  1     SAB      IAB      Ectopic      Multiple      Live Births              Family History  Problem Relation Age of Onset  . Cancer Maternal Aunt        breast  . Leukemia Maternal Uncle   . Cancer Paternal Uncle        pancreatic  . Hypertension Maternal Grandmother   . Healthy Mother   . Healthy Father     Social History   Tobacco Use  . Smoking status: Never Smoker  . Smokeless tobacco: Never Used  Vaping Use  . Vaping Use: Never used  Substance Use Topics  . Alcohol use: Not Currently  . Drug  use: Not Currently    Home Medications Prior to Admission medications   Medication Sig Start Date End Date Taking? Authorizing Provider  diclofenac (VOLTAREN) 75 MG EC tablet Take 1 tablet (75 mg total) by mouth 2 (two) times daily. 10/08/19   Fransico Meadow, PA-C  hydrOXYzine (ATARAX/VISTARIL) 25 MG tablet Take 1 tablet (25 mg total) by mouth 3 (three) times daily as needed for anxiety. 09/03/19   Lindell Spar I, NP  methocarbamol (ROBAXIN) 500 MG tablet Take 1 tablet (500 mg total) by mouth 4 (four) times daily. 10/08/19   Fransico Meadow, PA-C  sertraline (ZOLOFT) 25 MG tablet Take 1 tablet (25 mg total) by mouth daily. For depression 09/04/19   Lindell Spar I, NP  traZODone (DESYREL) 50 MG tablet Take 1 tablet (50 mg total) by mouth at bedtime as needed for sleep. 09/03/19   Encarnacion Slates, NP    Allergies    Latex  Review of Systems   Review of Systems  Constitutional: Positive for activity change.  Eyes: Positive for visual disturbance. Negative for photophobia and pain.  Gastrointestinal: Negative for nausea and vomiting.  Neurological: Positive for headaches.  All other systems reviewed and are negative.   Physical Exam Updated Vital Signs BP 122/84 (BP Location: Right Arm)   Pulse 82   Temp 98.3 F (36.8 C) (Oral)   Resp 18   Ht 5' (1.524 m)   Wt 70.3 kg   LMP 10/20/2020   SpO2 98%   BMI 30.27 kg/m   Physical Exam Vitals and nursing note reviewed.  Constitutional:      Appearance: She is well-developed.  HENT:     Head: Normocephalic and atraumatic.  Eyes:     General: No visual field deficit.    Extraocular Movements: Extraocular movements intact.     Pupils: Pupils are equal, round, and reactive to light.     Comments: Bedside visual acuity exam, finger counting is normal for both eyes  Cardiovascular:     Rate and Rhythm: Normal rate and regular rhythm.     Heart sounds: Normal heart sounds. No murmur heard.   Pulmonary:     Effort: Pulmonary effort is  normal. No respiratory distress.  Abdominal:     General: There is no distension.     Palpations: Abdomen is soft.     Tenderness: There is no abdominal tenderness. There is no guarding or rebound.  Musculoskeletal:     Cervical back: Neck supple.  Skin:    General: Skin is warm and dry.  Neurological:     Mental Status: She is alert and oriented to person, place, and time.     GCS: GCS eye subscore is 4. GCS verbal subscore is 5. GCS motor subscore is 6.     Cranial Nerves: No cranial nerve deficit, dysarthria or facial asymmetry.     Sensory: No sensory deficit.     Motor: No weakness.     Gait: Gait normal.     ED Results / Procedures / Treatments   Labs (all labs ordered are listed, but only abnormal results are displayed) Labs Reviewed - No data to display  EKG None  Radiology No results found.  Procedures Procedures   Medications Ordered in ED Medications  ketorolac (TORADOL) injection 60 mg (60 mg Intramuscular Given 10/29/20 2257)  metoCLOPramide (REGLAN) injection 10 mg (10 mg Intramuscular Given 10/29/20 2257)    ED Course  I have reviewed the triage vital signs and the nursing notes.  Pertinent labs & imaging results that were available during my care of the patient were reviewed by me and considered in my medical decision making (see chart for details).    MDM Rules/Calculators/A&P                          25 year old female comes in a chief complaint of headache.  Apparently she has history of similar headaches that have been ongoing for several months and suspects she might have migraine.  She has not been clinically diagnosed with migraine  Typically the headaches are provoked by high salt food intake, and there is associated visual deficits.  In 2020 patient did come to the ER with similar symptoms and she reports that she gets these attacks every 2 to 3 months, normally not as severe as this 1.  Headaches typically resolve in a day.  We shall manage  her conservatively and advised her to follow-up with neurology for optimal management.  Patient denies any family history of brain aneurysm, brain bleed, autoimmune conditions that affect the  brain, and she has no other neurodeficits besides visual disturbance.  Final Clinical Impression(s) / ED Diagnoses Final diagnoses:  Bad headache    Rx / DC Orders ED Discharge Orders    None       Varney Biles, MD 10/29/20 3976    Varney Biles, MD 10/29/20 2320

## 2020-10-29 NOTE — Discharge Instructions (Addendum)
We saw you in the ER for headaches. All the labs and imaging are normal. We are not sure what is causing your headaches, however, there appears to be no evidence of infection, bleeds or tumors based on our exam and results.  Please take Tylenol every 6 hours for the next 2 days, you can take Motrin in between for breakthrough pain. See your doctor if the pain persists, as you might need better medications or a specialist.

## 2020-10-29 NOTE — ED Triage Notes (Signed)
Pt c/o getting headaches after eating noodles.

## 2020-11-02 ENCOUNTER — Other Ambulatory Visit: Payer: Self-pay

## 2020-11-02 ENCOUNTER — Encounter: Payer: Self-pay | Admitting: Emergency Medicine

## 2020-11-02 ENCOUNTER — Ambulatory Visit
Admission: EM | Admit: 2020-11-02 | Discharge: 2020-11-02 | Disposition: A | Discharge status: Home / Self Care | Payer: Medicaid Other | Attending: Family Medicine | Admitting: Family Medicine

## 2020-11-02 DIAGNOSIS — R590 Localized enlarged lymph nodes: Secondary | ICD-10-CM | POA: Diagnosis not present

## 2020-11-02 MED ORDER — DOXYCYCLINE HYCLATE 100 MG PO CAPS: 100.0000 mg | ORAL_CAPSULE | Freq: Two times a day (BID) | ORAL | 0 refills | Status: DC

## 2020-11-02 NOTE — ED Triage Notes (Signed)
Pain under right underarm that radiates to right breast.  States this has been going on for a year.  States she has had an ultrasound that resulted negative.

## 2020-11-02 NOTE — ED Provider Notes (Signed)
[facility]    CSN: [id number] Arrival date & time: 11/02/20  1148      History   Chief Complaint No chief complaint on file.   HPI Paige Compton is a 25 y.o. female.   Reports pain under the right axilla that radiates down her chest and breast to her right nipple.  States that this has been going on intermittently for about a year.  Reports that the pain was so bad that it kept her up last night.  States that she had an ultrasound last year to have this checked and states that they did not find anything.  Has not attempted OTC treatment.  Does not see gynecology regularly.  She is not currently breast-feeding.  Denies headache, cough, shortness of breath, nausea, vomiting, diarrhea, abdominal pain, fever, chills, rash, other symptoms.  ROS per HPI  The history is provided by the patient.    Past Medical History:  Diagnosis Date  . Anxiety   . Depression   . Medical history non-contributory     Patient Active Problem List   Diagnosis Date Noted  . Generalized anxiety disorder   . Major depressive disorder, recurrent ([place]) 09/01/2019  . Depression 08/31/2019  . Rh negative state in antepartum period 06/02/2018  . LGSIL on Pap smear of cervix 06/02/2018  . Supervision of normal first pregnancy 05/23/2018    Past Surgical History:  Procedure Laterality Date  . NO PAST SURGERIES      OB History    Gravida  1   Para      Term      Preterm      AB      Living  1     SAB      IAB      Ectopic      Multiple      Live Births               Home Medications    Prior to Admission medications   Medication Sig Start Date End Date Taking? Authorizing Provider  doxycycline (VIBRAMYCIN) 100 MG capsule Take 1 capsule (100 mg total) by mouth 2 (two) times daily. 11/02/20  Yes Faustino Congress, NP  sertraline (ZOLOFT) 25 MG tablet Take 1 tablet (25 mg total) by mouth daily. For depression 09/04/19 11/02/20  Lindell Spar I, NP  traZODone  (DESYREL) 50 MG tablet Take 1 tablet (50 mg total) by mouth at bedtime as needed for sleep. 09/03/19 11/02/20  Encarnacion Slates, NP    Family History Family History  Problem Relation Age of Onset  . Cancer Maternal Aunt        breast  . Leukemia Maternal Uncle   . Cancer Paternal Uncle        pancreatic  . Hypertension Maternal Grandmother   . Healthy Mother   . Healthy Father     Social History Social History   Tobacco Use  . Smoking status: Never Smoker  . Smokeless tobacco: Never Used  Vaping Use  . Vaping Use: Never used  Substance Use Topics  . Alcohol use: Not Currently  . Drug use: Not Currently     Allergies   Latex   Review of Systems Review of Systems   Physical Exam Triage Vital Signs ED Triage Vitals  Enc Vitals Group     BP 11/02/20 1253 125/88     Pulse Rate 11/02/20 1253 94     Resp 11/02/20 1253 18     Temp  11/02/20 1253 97.8 F (36.6 C)     Temp Source 11/02/20 1253 Temporal     SpO2 11/02/20 1253 98 %     Weight --      Height --      Head Circumference --      Peak Flow --      Pain Score 11/02/20 1255 8     Pain Loc --      Pain Edu? --      Excl. in [place]? --    No data found.  Updated Vital Signs BP 125/88 (BP Location: Right Arm)   Pulse 94   Temp 97.8 F (36.6 C) (Temporal)   Resp 18   LMP 11/02/2020   SpO2 98%   Visual Acuity Right Eye Distance:   Left Eye Distance:   Bilateral Distance:    Right Eye Near:   Left Eye Near:    Bilateral Near:     Physical Exam Vitals and nursing note reviewed.  Constitutional:      General: She is not in acute distress.    Appearance: Normal appearance. She is well-developed. She is not ill-appearing.  HENT:     Head: Normocephalic and atraumatic.     Nose: Nose normal.     Mouth/Throat:     Mouth: Mucous membranes are moist.     Pharynx: Oropharynx is clear.  Eyes:     Extraocular Movements: Extraocular movements intact.     Conjunctiva/sclera: Conjunctivae normal.      Pupils: Pupils are equal, round, and reactive to light.  Cardiovascular:     Rate and Rhythm: Normal rate and regular rhythm.  Pulmonary:     Effort: Pulmonary effort is normal.  Chest:     Chest wall: Swelling and tenderness present.  Breasts:     Right: No inverted nipple or nipple discharge.     Left: No inverted nipple or nipple discharge.        Comments: Area of tenderness, mild swelling. No erythema, no masses appreciated, no drainage. Musculoskeletal:     Cervical back: Normal range of motion and neck supple.  Skin:    General: Skin is warm and dry.     Capillary Refill: Capillary refill takes less than 2 seconds.  Neurological:     General: No focal deficit present.     Mental Status: She is alert and oriented to person, place, and time.  Psychiatric:        Mood and Affect: Mood normal.        Behavior: Behavior normal.        Thought Content: Thought content normal.      UC Treatments / Results  Labs (all labs ordered are listed, but only abnormal results are displayed) Labs Reviewed - No data to display  EKG   Radiology No results found.  Procedures Procedures (including critical care time)  Medications Ordered in UC Medications - No data to display  Initial Impression / Assessment and Plan / UC Course  I have reviewed the triage vital signs and the nursing notes.  Pertinent labs & imaging results that were available during my care of the patient were reviewed by me and considered in my medical decision making (see chart for details).    Lymphadenopathy  Suspect lymphadenopathy May take motrin prn pain Prescribed doxycycline BID x 7 days May use ice to the area Follow up with this office or with primary care if symptoms are persisting.  Follow up in the ER  for high fever, trouble swallowing, trouble breathing, other concerning symptoms.   Final Clinical Impressions(s) / UC Diagnoses   Final diagnoses:  Lymphadenopathy, axillary      Discharge Instructions     I have sent in doxycycline for you to take one tablet twice a day for 10 days  May use ibuprofen/tylenol as needed for pain  May use ice to the area  Follow up with this office or with primary care if symptoms are persisting.  Follow up in the ER for high fever, trouble swallowing, trouble breathing, other concerning symptoms.     ED Prescriptions    Medication Sig Dispense Auth. Provider   doxycycline (VIBRAMYCIN) 100 MG capsule Take 1 capsule (100 mg total) by mouth 2 (two) times daily. 14 capsule Faustino Congress, NP     PDMP not reviewed this encounter.   Faustino Congress, NP 11/02/20 1452

## 2020-11-02 NOTE — Discharge Instructions (Signed)
I have sent in doxycycline for you to take one tablet twice a day for 10 days  May use ibuprofen/tylenol as needed for pain  May use ice to the area  Follow up with this office or with primary care if symptoms are persisting.  Follow up in the ER for high fever, trouble swallowing, trouble breathing, other concerning symptoms.

## 2021-02-28 ENCOUNTER — Other Ambulatory Visit: Payer: Self-pay

## 2021-02-28 ENCOUNTER — Ambulatory Visit
Admission: EM | Admit: 2021-02-28 | Discharge: 2021-02-28 | Disposition: A | Discharge status: Home / Self Care | Payer: Medicaid Other | Attending: Emergency Medicine | Admitting: Emergency Medicine

## 2021-02-28 ENCOUNTER — Encounter: Payer: Self-pay | Admitting: Emergency Medicine

## 2021-02-28 DIAGNOSIS — S61012A Laceration without foreign body of left thumb without damage to nail, initial encounter: Secondary | ICD-10-CM | POA: Diagnosis not present

## 2021-02-28 MED ORDER — MUPIROCIN 2 % EX OINT: 1.0000 "application " | TOPICAL_OINTMENT | Freq: Two times a day (BID) | CUTANEOUS | 0 refills | Status: DC

## 2021-02-28 NOTE — ED Provider Notes (Signed)
[facility]   JI:[id number] 02/28/21 Arrival Time: 1409   CC: cut on thumb  SUBJECTIVE:  Paige Compton is a 25 y.o. female who presents with a cut to LT thumb that occurred 3-4 days ago.  Cut with knife.  Sore to the touch.  Bleeding controlled.   Concerned for infection.  Denies fever, chills, nausea, vomiting, redness, swelling, drainage.    ROS: As per HPI.  All other pertinent ROS negative.     Past Medical History:  Diagnosis Date   Anxiety    Depression    Medical history non-contributory    Past Surgical History:  Procedure Laterality Date   NO PAST SURGERIES     Allergies  Allergen Reactions   Latex Swelling   No current facility-administered medications on file prior to encounter.   Current Outpatient Medications on File Prior to Encounter  Medication Sig Dispense Refill   doxycycline (VIBRAMYCIN) 100 MG capsule Take 1 capsule (100 mg total) by mouth 2 (two) times daily. 14 capsule 0   [DISCONTINUED] sertraline (ZOLOFT) 25 MG tablet Take 1 tablet (25 mg total) by mouth daily. For depression 30 tablet 0   [DISCONTINUED] traZODone (DESYREL) 50 MG tablet Take 1 tablet (50 mg total) by mouth at bedtime as needed for sleep. 30 tablet 0   Social History   Socioeconomic History   Marital status: Single    Spouse name: Not on file   Number of children: Not on file   Years of education: Not on file   Highest education level: Not on file  Occupational History   Not on file  Tobacco Use   Smoking status: Never   Smokeless tobacco: Never  Vaping Use   Vaping Use: Never used  Substance and Sexual Activity   Alcohol use: Not Currently   Drug use: Not Currently   Sexual activity: Yes    Birth control/protection: None  Other Topics Concern   Not on file  Social History Narrative   Not on file   Social Determinants of Health   Financial Resource Strain: Not on file  Food Insecurity: Not on file  Transportation Needs: Not on file  Physical Activity:  Not on file  Stress: Not on file  Social Connections: Not on file  Intimate Partner Violence: Not on file   Family History  Problem Relation Age of Onset   Cancer Maternal Aunt        breast   Leukemia Maternal Uncle    Cancer Paternal Uncle        pancreatic   Hypertension Maternal Grandmother    Healthy Mother    Healthy Father     OBJECTIVE:  Vitals:   02/28/21 1441  BP: 119/72  Pulse: 99  Resp: 16  Temp: 98.6 F (37 C)  TempSrc: Oral  SpO2: 96%     General appearance: alert; no distress CV: radial pulse 2+ Skin: 0.25 cm superficial laceration of her anterior distal first digit; mildly tender to touch; no active drainage Psychological: alert and cooperative; normal mood and affect   ASSESSMENT & PLAN:  1. Laceration of left thumb without foreign body without damage to nail, initial encounter     Meds ordered this encounter  Medications   mupirocin ointment (BACTROBAN) 2 %    Sig: Apply 1 application topically 2 (two) times daily.    Dispense:  22 g    Refill:  0    Order Specific Question:   Supervising Provider    Answer:  Raylene Everts [id number]    Wash site daily with warm water and mild soap Keep covered to avoid friction Bactroban prescribed Follow up here or with PCP if symptoms persists Return or go to the ED if you have any new or worsening symptoms increased redness, swelling, pain, nausea, vomiting, fever, chills, etc...   Reviewed expectations re: course of current medical issues. Questions answered. Outlined signs and symptoms indicating need for more acute intervention. Patient verbalized understanding. After Visit Summary given.           Lestine Box, PA-C 02/28/21 1504

## 2021-02-28 NOTE — Discharge Instructions (Signed)
  Wash site daily with warm water and mild soap Keep covered to avoid friction Bactroban prescribed Follow up here or with PCP if symptoms persists Return or go to the ED if you have any new or worsening symptoms increased redness, swelling, pain, nausea, vomiting, fever, chills, etc..Marland Kitchen

## 2021-02-28 NOTE — ED Triage Notes (Signed)
Cut left thumb 3 to 4 days ago.  States area bleeds at times

## 2022-01-07 ENCOUNTER — Encounter: Payer: Self-pay | Admitting: Emergency Medicine

## 2022-01-07 ENCOUNTER — Ambulatory Visit
Admission: EM | Admit: 2022-01-07 | Discharge: 2022-01-07 | Disposition: A | Discharge status: Home / Self Care | Payer: Medicaid Other | Attending: Nurse Practitioner | Admitting: Nurse Practitioner

## 2022-01-07 DIAGNOSIS — M545 Low back pain, unspecified: Secondary | ICD-10-CM | POA: Diagnosis present

## 2022-01-07 DIAGNOSIS — N898 Other specified noninflammatory disorders of vagina: Secondary | ICD-10-CM

## 2022-01-07 LAB — POCT URINALYSIS DIP (MANUAL ENTRY)
Bilirubin, UA: NEGATIVE
Blood, UA: NEGATIVE
Glucose, UA: NEGATIVE mg/dL
Ketones, POC UA: NEGATIVE mg/dL
Leukocytes, UA: NEGATIVE
Nitrite, UA: NEGATIVE
Protein Ur, POC: NEGATIVE mg/dL
Spec Grav, UA: 1.025 (ref 1.010–1.025)
Urobilinogen, UA: 1 E.U./dL
pH, UA: 5.5 (ref 5.0–8.0)

## 2022-01-07 LAB — POCT URINE PREGNANCY: Preg Test, Ur: NEGATIVE

## 2022-01-07 MED ORDER — FLUCONAZOLE 150 MG PO TABS: 150.0000 mg | ORAL_TABLET | Freq: Every day | ORAL | 1 refills | Status: AC

## 2022-01-07 MED ORDER — CYCLOBENZAPRINE HCL 5 MG PO TABS: 5.0000 mg | ORAL_TABLET | Freq: Every evening | ORAL | 0 refills | Status: AC | PRN

## 2022-01-07 NOTE — Discharge Instructions (Signed)
-   Take the fluconazole to treat the yeast infection -Comes back showing something different than a yeast infection, we will call you and arrange treatment -You can take the Flexeril at nighttime as needed for muscle pain; you can take Tylenol/ibuprofen during the day and use heat or ice to help with the pain\ -Follow-up with your primary care provider if your symptoms persist or worsen despite this treatment

## 2022-01-07 NOTE — ED Triage Notes (Signed)
Vaginal itching with (creamy) discharge x 1 week.  Lower right back pain x 2 days.

## 2022-01-07 NOTE — ED Provider Notes (Signed)
[facility]    CSN: [id number] Arrival date & time: 01/07/22  1439      History   Chief Complaint No chief complaint on file.   HPI Paige Compton is a 26 y.o. female.   Patient presents with back pain that started a couple of days ago.  She denies any recent fall, accident, trauma, or injury to her back.  Reports the pain is a 6 out of 10 and describes the pain as a shooting/throbbing.  The pain does not radiate to her buttocks or down either leg.  She has taken ibuprofen without relief of pain.  She denies any numbness or tingling in her toes or down her leg, saddle anesthesia, new bowel or bladder incontinence, fevers, nausea/vomiting, dysuria, and urinary frequency.  The patient is also complaining of vaginal discharge that she noticed a few days ago.  Reports the discharge is thin, kind of chunky, nonodorous.  She does have some outer vaginal irritation.  She denies any lesions, rashes, or sores on her genitalia.  She denies recent sexual activity.  No nausea vomiting, urinary tract infection symptoms.    Past Medical History:  Diagnosis Date   Anxiety    Depression    Medical history non-contributory     Patient Active Problem List   Diagnosis Date Noted   Generalized anxiety disorder    Major depressive disorder, recurrent ([place]) 09/01/2019   Depression 08/31/2019   Rh negative state in antepartum period 06/02/2018   LGSIL on Pap smear of cervix 06/02/2018   Supervision of normal first pregnancy 05/23/2018    Past Surgical History:  Procedure Laterality Date   NO PAST SURGERIES      OB History     Gravida  1   Para      Term      Preterm      AB      Living  1      SAB      IAB      Ectopic      Multiple      Live Births               Home Medications    Prior to Admission medications   Medication Sig Start Date End Date Taking? Authorizing Provider  cyclobenzaprine (FLEXERIL) 5 MG tablet Take 1 tablet (5 mg total) by  mouth at bedtime as needed for muscle spasms. Take with alcohol or while driving or operating heavy machinery 01/07/22  Yes Noemi Chapel A, NP  fluconazole (DIFLUCAN) 150 MG tablet Take 1 tablet (150 mg total) by mouth daily. May repeat dose in 3 days if symptoms do not improve 01/07/22  Yes Noemi Chapel A, NP  sertraline (ZOLOFT) 25 MG tablet Take 1 tablet (25 mg total) by mouth daily. For depression 09/04/19 11/02/20  Lindell Spar I, NP  traZODone (DESYREL) 50 MG tablet Take 1 tablet (50 mg total) by mouth at bedtime as needed for sleep. 09/03/19 11/02/20  Encarnacion Slates, NP    Family History Family History  Problem Relation Age of Onset   Cancer Maternal Aunt        breast   Leukemia Maternal Uncle    Cancer Paternal Uncle        pancreatic   Hypertension Maternal Grandmother    Healthy Mother    Healthy Father     Social History Social History   Tobacco Use   Smoking status: Never   Smokeless tobacco: Never  Vaping Use   Vaping Use: Never used  Substance Use Topics   Alcohol use: Not Currently   Drug use: Not Currently     Allergies   Latex   Review of Systems Review of Systems Per HPI  Physical Exam Triage Vital Signs ED Triage Vitals  Enc Vitals Group     BP 01/07/22 1448 113/75     Pulse Rate 01/07/22 1448 99     Resp 01/07/22 1448 18     Temp 01/07/22 1448 98 F (36.7 C)     Temp Source 01/07/22 1448 Oral     SpO2 01/07/22 1448 95 %     Weight --      Height --      Head Circumference --      Peak Flow --      Pain Score 01/07/22 1449 6     Pain Loc --      Pain Edu? --      Excl. in [place]? --    No data found.  Updated Vital Signs BP 113/75 (BP Location: Right Arm)   Pulse 99   Temp 98 F (36.7 C) (Oral)   Resp 18   LMP 12/12/2021 (Approximate)   SpO2 95%   Visual Acuity Right Eye Distance:   Left Eye Distance:   Bilateral Distance:    Right Eye Near:   Left Eye Near:    Bilateral Near:     Physical Exam Vitals reviewed.   Constitutional:      General: She is not in acute distress.    Appearance: Normal appearance. She is not toxic-appearing.  HENT:     Head: Normocephalic and atraumatic.     Mouth/Throat:     Mouth: Mucous membranes are moist.     Pharynx: Oropharynx is clear.  Pulmonary:     Effort: Pulmonary effort is normal. No respiratory distress.  Abdominal:     Tenderness: There is no right CVA tenderness or left CVA tenderness.  Genitourinary:    Comments: Deferred-self swab obtained Musculoskeletal:     Lumbar back: Tenderness present. No swelling, spasms or bony tenderness. Normal range of motion. Negative right straight leg raise test and negative left straight leg raise test.       Back:     Comments: Inspection: No obvious swelling, deformity, redness to the lumbar spine or low back  Palpation: Tender to palpation in the area marked; no obvious deformities palpated  ROM: Full ROM of back Strength: 5/5 bilateral lower extremities Neurovascular: neurovascularly intact in left and right lower extremity   Skin:    General: Skin is warm and dry.     Coloration: Skin is not jaundiced or pale.     Findings: No erythema.  Neurological:     Mental Status: She is alert and oriented to person, place, and time.  Psychiatric:        Behavior: Behavior is cooperative.      UC Treatments / Results  Labs (all labs ordered are listed, but only abnormal results are displayed) Labs Reviewed  POCT URINALYSIS DIP (MANUAL ENTRY)  POCT URINE PREGNANCY  CERVICOVAGINAL ANCILLARY ONLY    EKG   Radiology No results found.  Procedures Procedures (including critical care time)  Medications Ordered in UC Medications - No data to display  Initial Impression / Assessment and Plan / UC Course  I have reviewed the triage vital signs and the nursing notes.  Pertinent labs & imaging results that were available during my  care of the patient were reviewed by me and considered in my medical  decision making (see chart for details).    Patient is a very pleasant, well-appearing 26 year old female presenting for vaginitis and back pain.  Urinalysis is negative today, U pregnant is negative.  We will treat vaginitis with fluconazole, self swab obtained to check for bacterial vaginosis.  She is not concerned about STI today and declines HIV and syphilis testing.  Back pain is muscular-treat with cyclobenzaprine 5 mg at nighttime as needed, Tylenol during the day.  Encourage stretching, ice/heat.  Seek care if symptoms persist or worsen despite treatment. Final Clinical Impressions(s) / UC Diagnoses   Final diagnoses:  Vaginal discharge  Vaginal itching  Acute right-sided low back pain without sciatica     Discharge Instructions      - Take the fluconazole to treat the yeast infection -Comes back showing something different than a yeast infection, we will call you and arrange treatment -You can take the Flexeril at nighttime as needed for muscle pain; you can take Tylenol/ibuprofen during the day and use heat or ice to help with the pain\ -Follow-up with your primary care provider if your symptoms persist or worsen despite this treatment     ED Prescriptions     Medication Sig Dispense Auth. Provider   fluconazole (DIFLUCAN) 150 MG tablet Take 1 tablet (150 mg total) by mouth daily. May repeat dose in 3 days if symptoms do not improve 1 tablet Noemi Chapel A, NP   cyclobenzaprine (FLEXERIL) 5 MG tablet Take 1 tablet (5 mg total) by mouth at bedtime as needed for muscle spasms. Take with alcohol or while driving or operating heavy machinery 30 tablet Eulogio Bear, NP      PDMP not reviewed this encounter.   Eulogio Bear, NP 01/07/22 1514

## 2022-01-08 LAB — CERVICOVAGINAL ANCILLARY ONLY
Bacterial Vaginitis (gardnerella): NEGATIVE
Candida Glabrata: NEGATIVE
Candida Vaginitis: NEGATIVE
Chlamydia: NEGATIVE
Comment: NEGATIVE
Comment: NEGATIVE
Comment: NEGATIVE
Comment: NEGATIVE
Comment: NEGATIVE
Comment: NORMAL
Neisseria Gonorrhea: NEGATIVE
Trichomonas: NEGATIVE

## 2022-03-07 ENCOUNTER — Emergency Department (HOSPITAL_COMMUNITY): Payer: Medicaid Other

## 2022-03-07 ENCOUNTER — Encounter (HOSPITAL_COMMUNITY): Payer: Self-pay | Admitting: *Deleted

## 2022-03-07 ENCOUNTER — Emergency Department (HOSPITAL_COMMUNITY)
Admission: EM | Admit: 2022-03-07 | Discharge: 2022-03-07 | Disposition: A | Discharge status: Home / Self Care | Payer: Medicaid Other | Attending: Emergency Medicine | Admitting: Emergency Medicine

## 2022-03-07 ENCOUNTER — Other Ambulatory Visit: Payer: Self-pay

## 2022-03-07 DIAGNOSIS — Z91040 Latex allergy status: Secondary | ICD-10-CM | POA: Diagnosis not present

## 2022-03-07 DIAGNOSIS — W540XXA Bitten by dog, initial encounter: Secondary | ICD-10-CM | POA: Diagnosis not present

## 2022-03-07 DIAGNOSIS — Z23 Encounter for immunization: Secondary | ICD-10-CM | POA: Insufficient documentation

## 2022-03-07 DIAGNOSIS — S91052A Open bite, left ankle, initial encounter: Secondary | ICD-10-CM | POA: Diagnosis present

## 2022-03-07 MED ORDER — AMOXICILLIN-POT CLAVULANATE 875-125 MG PO TABS: 1.0000 | ORAL_TABLET | Freq: Two times a day (BID) | ORAL | 0 refills | Status: AC

## 2022-03-07 MED ORDER — AMOXICILLIN-POT CLAVULANATE 875-125 MG PO TABS
1.0000 | ORAL_TABLET | Freq: Once | ORAL | Status: AC
  Administered 2022-03-07: 1 via ORAL
  Filled 2022-03-07: qty 1

## 2022-03-07 MED ORDER — TETANUS-DIPHTH-ACELL PERTUSSIS 5-2.5-18.5 LF-MCG/0.5 IM SUSY
0.5000 mL | PREFILLED_SYRINGE | Freq: Once | INTRAMUSCULAR | Status: AC
  Administered 2022-03-07: 0.5 mL via INTRAMUSCULAR
  Filled 2022-03-07: qty 0.5

## 2022-03-07 NOTE — ED Notes (Signed)
Address that it occurred at per pt is  [address] [place], [place] [address]

## 2022-03-07 NOTE — ED Notes (Signed)
[facility] Deputy came and spoke with the patient. He advised that the owner had verification records of updated vaccines. ED Provider notified.

## 2022-03-07 NOTE — ED Provider Notes (Signed)
[facility] EMERGENCY DEPARTMENT Provider Note   CSN: [id number] Arrival date & time: 03/07/22  1847     History  Chief Complaint  Patient presents with   Animal Bite    Paige Compton is a 26 y.o. female.  HPI   Without significant medical history presents complaints of a dog bite.  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  She states she has some pain around her ankle but is able to ambulate, no paresthesias or weakness moving down her foot, she is not immunocompromise, she is not remember the last time she had a tetanus shot, she is unclear if this dog is up-to-date on its rabies vaccine, she states it was customer's dog and had a collar on, she has not yet spoken with animal control.    Home Medications Prior to Admission medications   Medication Sig Start Date End Date Taking? Authorizing Provider  amoxicillin-clavulanate (AUGMENTIN) 875-125 MG tablet Take 1 tablet by mouth every 12 (twelve) hours for 7 days. 03/07/22 03/14/22 Yes Marcello Fennel, PA-C  cyclobenzaprine (FLEXERIL) 5 MG tablet Take 1 tablet (5 mg total) by mouth at bedtime as needed for muscle spasms. Take with alcohol or while driving or operating heavy machinery 01/07/22   Eulogio Bear, NP  fluconazole (DIFLUCAN) 150 MG tablet Take 1 tablet (150 mg total) by mouth daily. May repeat dose in 3 days if symptoms do not improve 01/07/22   Eulogio Bear, NP  sertraline (ZOLOFT) 25 MG tablet Take 1 tablet (25 mg total) by mouth daily. For depression 09/04/19 11/02/20  Lindell Spar I, NP  traZODone (DESYREL) 50 MG tablet Take 1 tablet (50 mg total) by mouth at bedtime as needed for sleep. 09/03/19 11/02/20  Lindell Spar I, NP      Allergies    Latex    Review of Systems   Review of  Systems  Constitutional:  Negative for chills and fever.  Respiratory:  Negative for shortness of breath.   Cardiovascular:  Negative for chest pain.  Gastrointestinal:  Negative for abdominal pain.  Skin:  Positive for wound.  Neurological:  Negative for headaches.    Physical Exam Updated Vital Signs BP 118/80   Pulse 78   Temp 98.1 F (36.7 C)   Resp 14   Ht 5' (1.524 m)   Wt 71.7 kg   SpO2 99%   BMI 30.86 kg/m  Physical Exam Vitals and nursing note reviewed.  Constitutional:      General: She is not in acute distress.    Appearance: She is not ill-appearing.  HENT:     Head: Normocephalic and atraumatic.     Nose: No congestion.  Eyes:     Conjunctiva/sclera: Conjunctivae normal.  Cardiovascular:     Rate and Rhythm: Normal rate and regular rhythm.     Pulses: Normal pulses.     Heart sounds:     No friction rub.  Pulmonary:     Effort: Pulmonary effort is normal.  Musculoskeletal:     Comments: Focused exam of the left ankle reveals a small superficial laceration on the lateral malleolus, hemodynamically stable, she had point tenderness along her lateral malleolus, no crepitus deformities noted, she has full range of motion her toes and  ankle, 2+ dorsal pedal pulses.  Skin:    General: Skin is warm and dry.  Neurological:     Mental Status: She is alert.  Psychiatric:        Mood and Affect: Mood normal.     ED Results / Procedures / Treatments   Labs (all labs ordered are listed, but only abnormal results are displayed) Labs Reviewed - No data to display  EKG None  Radiology DG Ankle Complete Left  Result Date: 03/07/2022 CLINICAL DATA:  Animal bite lateral malleolus. EXAM: LEFT ANKLE COMPLETE - 3+ VIEW COMPARISON:  None Available. FINDINGS: There is no evidence of fracture, dislocation, or joint effusion. There is no evidence of arthropathy or other focal bone abnormality. Soft tissues are unremarkable. No foreign body identified. IMPRESSION:  Negative. Electronically Signed   By: Ronney Asters M.D.   On: 03/07/2022 20:21    Procedures Procedures    Medications Ordered in ED Medications  Tdap (BOOSTRIX) injection 0.5 mL (0.5 mLs Intramuscular Given 03/07/22 2007)  amoxicillin-clavulanate (AUGMENTIN) 875-125 MG per tablet 1 tablet (1 tablet Oral Given 03/07/22 2009)    ED Course/ Medical Decision Making/ A&P                           Medical Decision Making Amount and/or Complexity of Data Reviewed Radiology: ordered.  Risk Prescription drug management.   This patient presents to the ED for concern of dog bite, this involves an extensive number of treatment options, and is a complaint that carries with it a high risk of complications and morbidity.  The differential diagnosis includes fracture, dislocation, compartment syndrome    Additional history obtained:  Additional history obtained from N/A External records from outside source obtained and reviewed including immunization records   Co morbidities that complicate the patient evaluation  N/A  Social Determinants of Health:  N/A    Lab Tests:  I Ordered, and personally interpreted labs.  The pertinent results include: N/A   Imaging Studies ordered:  I ordered imaging studies including x-ray of left ankle I independently visualized and interpreted imaging which showed negative  for acute findings I agree with the radiologist interpretation   Cardiac Monitoring:  The patient was maintained on a cardiac monitor.  I personally viewed and interpreted the cardiac monitored which showed an underlying rhythm of: N/A   Medicines ordered and prescription drug management:  I ordered medication including Tdap, Augmentin I have reviewed the patients home medicines and have made adjustments as needed  Critical Interventions:  N/A   Reevaluation:  Presents with dog bite wound, will obtain imaging for rule out of fracture, update tetanus shot, started  on antibiotics   Consultations Obtained:  N/A  Test Considered:  Rabies vaccination-as animal control was already notified, they had verified that the dog is fully vaccinated.    Rule out Low suspicion for fracture or dislocation as x-ray does not feel any significant findings. low suspicion for ligament or tendon damage as area was palpated no gross defects noted, they had full range of motion as well as 5/5 strength.  Low suspicion for compartment syndrome as area was palpated it was soft to the touch, neurovascular fully intact.     Dispostion and problem list  After consideration of the diagnostic results and the patients response to treatment, I feel that the patent would benefit from discharge.  Animal bite-patient was given her first dose of Augmentin, we will have her continue  with this, she may follow-up with her PCP for further evaluation and strict return precautions.          Final Clinical Impression(s) / ED Diagnoses Final diagnoses:  Dog bite, initial encounter    Rx / DC Orders ED Discharge Orders          Ordered    amoxicillin-clavulanate (AUGMENTIN) 875-125 MG tablet  Every 12 hours        03/07/22 2037              Marcello Fennel, PA-C 03/07/22 2037    Milton Ferguson, MD 03/08/22 1025

## 2022-03-07 NOTE — Discharge Instructions (Signed)
I have started you on antibiotics take as prescribed, please keep the wound clean.  If you notice after 3 to 4 days of antibiotic use you have worsening redness pain or swelling around your wound please come back in for reassessment.

## 2022-03-07 NOTE — ED Triage Notes (Signed)
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  Pt with small abrasion to left ankle.  Dog's shot status is unknown.

## 2022-03-20 ENCOUNTER — Encounter (HOSPITAL_COMMUNITY): Payer: Self-pay | Admitting: Emergency Medicine

## 2022-03-20 ENCOUNTER — Emergency Department (HOSPITAL_COMMUNITY)
Admission: EM | Admit: 2022-03-20 | Discharge: 2022-03-20 | Discharge status: Against Medical Advice | Payer: Medicaid Other | Attending: Emergency Medicine | Admitting: Emergency Medicine

## 2022-03-20 ENCOUNTER — Other Ambulatory Visit: Payer: Self-pay

## 2022-03-20 DIAGNOSIS — Z77120 Contact with and (suspected) exposure to mold (toxic): Secondary | ICD-10-CM | POA: Insufficient documentation

## 2022-03-20 DIAGNOSIS — Z91040 Latex allergy status: Secondary | ICD-10-CM | POA: Insufficient documentation

## 2022-03-20 DIAGNOSIS — Z5321 Procedure and treatment not carried out due to patient leaving prior to being seen by health care provider: Secondary | ICD-10-CM | POA: Diagnosis not present

## 2022-03-20 DIAGNOSIS — J029 Acute pharyngitis, unspecified: Secondary | ICD-10-CM | POA: Diagnosis present

## 2022-03-20 NOTE — Discharge Instructions (Signed)
Note the visit to the emergency department today was overall reassuring.  As discussed, please spray areas of visible black mold with bleach and let sit.  I advised turning off of AC units until problem is able to be addressed by her landlord.  Consider living at a friend or Associates house until problem can be cleared so as to avoid further exposure.  Close follow-up with your PCP recommended in 3 to 5 days for reevaluation of your symptoms.  Please not hesitate to return to the emergency department if the worrisome signs and symptoms we discussed become apparent.

## 2022-03-20 NOTE — ED Provider Notes (Signed)
[facility] EMERGENCY DEPARTMENT Provider Note   CSN: [id number] Arrival date & time: 03/20/22  1931     History  Chief Complaint  Patient presents with   Sore Throat    Dry throat    Paige Compton is a 26 y.o. female.   Sore Throat   26 year old female presents emergency department with concerns of black mold exposure.  Patient states that she was told by a companion who looked in her air vents that there was black spots in her events and was concerned about mold.  Patient also states that her daughter has been staying at her father's house who has known black mold that has not been addressed by his landlord as well.  Patient seems most concerned about her daughter who has had intermittent nasal congestion, ear infections over the past year.  Patient states that she has tried at home air humidifiers with some relief of symptoms.  She is most concerned about complications of black mold infection.  Denies history of HIV, immunosuppression via drugs or underlying condition, fever, chills, night sweats, chest pain, shortness of breath, abdominal pain, nausea, vomiting, urinary/vaginal symptoms, change in bowel habits.  She states she also has a landlord who she is not able to contact until Monday about said black mold.  Patient currently endorses a mildly sore throat.  Past medical history significant for anxiety, major depressive disorder  Home Medications Prior to Admission medications   Medication Sig Start Date End Date Taking? Authorizing Provider  cyclobenzaprine (FLEXERIL) 5 MG tablet Take 1 tablet (5 mg total) by mouth at bedtime as needed for muscle spasms. Take with alcohol or while driving or operating heavy machinery 01/07/22   Eulogio Bear, NP  fluconazole (DIFLUCAN) 150 MG tablet Take 1 tablet (150 mg total) by mouth daily. May repeat dose in 3 days if symptoms do not improve 01/07/22   Eulogio Bear, NP  sertraline (ZOLOFT) 25 MG tablet Take 1 tablet (25 mg  total) by mouth daily. For depression 09/04/19 11/02/20  Lindell Spar I, NP  traZODone (DESYREL) 50 MG tablet Take 1 tablet (50 mg total) by mouth at bedtime as needed for sleep. 09/03/19 11/02/20  Encarnacion Slates, NP      Allergies    Latex    Review of Systems   Review of Systems  All other systems reviewed and are negative.   Physical Exam Updated Vital Signs BP 123/87   Pulse 68   Temp 98 F (36.7 C) (Oral)   Resp 20   Ht 5' (1.524 m)   Wt 72.6 kg   LMP  (LMP Unknown)   SpO2 95%   BMI 31.25 kg/m  Physical Exam Vitals and nursing note reviewed.  Constitutional:      General: She is not in acute distress.    Appearance: She is well-developed.  HENT:     Head: Normocephalic and atraumatic.     Mouth/Throat:     Comments: Minimal to no posterior pharyngeal erythema.  Tonsils 1+ bilaterally with no obvious erythema or exudate.  Uvula midline and rises symmetrically with phonation.  No edema noted beneath the tongue.  No swelling of the submandibular space. Eyes:     Conjunctiva/sclera: Conjunctivae normal.  Cardiovascular:     Rate and Rhythm: Normal rate and regular rhythm.     Heart sounds: No murmur heard. Pulmonary:     Effort: Pulmonary effort is normal. No respiratory distress.     Breath sounds: Normal breath sounds.  No wheezing or rales.  Abdominal:     Palpations: Abdomen is soft.     Tenderness: There is no abdominal tenderness.  Musculoskeletal:        General: No swelling.     Cervical back: Neck supple.  Skin:    General: Skin is warm and dry.     Capillary Refill: Capillary refill takes less than 2 seconds.  Neurological:     Mental Status: She is alert.  Psychiatric:        Mood and Affect: Mood normal.     ED Results / Procedures / Treatments   Labs (all labs ordered are listed, but only abnormal results are displayed) Labs Reviewed - No data to display  EKG None  Radiology No results found.  Procedures Procedures    Medications  Ordered in ED Medications - No data to display  ED Course/ Medical Decision Making/ A&P                           Medical Decision Making  This patient presents to the ED for concern of black mold exposure, this involves an extensive number of treatment options, and is a complaint that carries with it a high risk of complications and morbidity.  The differential diagnosis includes black mold exposure   Co morbidities that complicate the patient evaluation  See HPI   Additional history obtained:  Additional history obtained from EMR  Lab Tests:  N/a   Imaging Studies ordered:  N/a   Cardiac Monitoring: / EKG:  The patient was maintained on a cardiac monitor.  I personally viewed and interpreted the cardiac monitored which showed an underlying rhythm of: Sinus rhythm   Consultations Obtained:  N/a   Problem List / ED Course / Critical interventions / Medication management  Black mold exposure Reevaluation of the patient  showed that the patient stayed the same I have reviewed the patients home medicines and have made adjustments as needed   Social Determinants of Health:  Exposure to black mold in the house.  Denies tobacco or illicit drug use.   Test / Admission - Considered:  Black mold exposure Vitals signs within normal range and stable throughout visit. Laboratory/imaging studies considered but deemed necessary due to stable patient status, lack of systemic symptoms as well as exacerbating factor of black mold.  Patient not actively immunocompromised along with daughter so further work-up deemed unnecessary at this time.  Patient recommended to spray bleach on visible parts of black mold as well as turn off AC units/heating units until problem is able to be addressed by landlord.  If possible, living at a friend or Associates house until problems can be addressed would be preferred.  In the meantime, symptomatic therapy recommended with Tylenol/ibuprofen.   Close follow-up with PCP recommended in 3 to 5 days for reevaluation.  Treatment plan discussed at length with patient she acknowledged understand was agreeable to said plan. Worrisome signs and symptoms were discussed with the patient, and the patient acknowledged understanding to return to the ED if noticed. Patient was stable upon discharge.          Final Clinical Impression(s) / ED Diagnoses Final diagnoses:  Suspected exposure to mold    Rx / DC Orders ED Discharge Orders     None         Wilnette Kales, [place] 03/20/22 2044    Fransico Meadow, MD 03/21/22 [phone]

## 2022-03-20 NOTE — ED Notes (Signed)
Pt signed out AMA and states she doesn't want to wait for discharge papers. She is alert, oriented, ambulatory with steady gait upon leaving ED. Bryson Corona Edd Fabian

## 2022-03-20 NOTE — ED Triage Notes (Signed)
Pt concerned about mold exposure. C/o dry/sore throat. Symptoms unrelieved by humidifier or purifier. Bryson Corona Edd Fabian

## 2022-03-20 NOTE — ED Notes (Signed)
Patient ambulated to restroom with steady gait with daughter.  Patient in NAD.

## 2022-04-04 ENCOUNTER — Ambulatory Visit
Admission: EM | Admit: 2022-04-04 | Discharge: 2022-04-04 | Disposition: A | Discharge status: Home / Self Care | Payer: Medicaid Other | Attending: Urgent Care | Admitting: Urgent Care

## 2022-04-04 DIAGNOSIS — N912 Amenorrhea, unspecified: Secondary | ICD-10-CM

## 2022-04-04 DIAGNOSIS — K529 Noninfective gastroenteritis and colitis, unspecified: Secondary | ICD-10-CM

## 2022-04-04 LAB — POCT URINALYSIS DIP (MANUAL ENTRY)
Bilirubin, UA: NEGATIVE
Blood, UA: NEGATIVE
Glucose, UA: NEGATIVE mg/dL
Ketones, POC UA: NEGATIVE mg/dL
Leukocytes, UA: NEGATIVE
Nitrite, UA: NEGATIVE
Protein Ur, POC: NEGATIVE mg/dL
Spec Grav, UA: >=1.03 — AB (ref 1.010–1.025)
Urobilinogen, UA: 0.2 E.U./dL
pH, UA: 5.5 (ref 5.0–8.0)

## 2022-04-04 LAB — POCT URINE PREGNANCY: Preg Test, Ur: NEGATIVE

## 2022-04-04 MED ORDER — ONDANSETRON 8 MG PO TBDP: 8.0000 mg | ORAL_TABLET | Freq: Three times a day (TID) | ORAL | 0 refills | Status: AC | PRN

## 2022-04-04 NOTE — ED Triage Notes (Signed)
Pt reports stomach cramps, certain foods are making stomach upset irregular periods haven't had one ion months. Started about 2 weeks ago. Took tylenol little to no relief.

## 2022-04-04 NOTE — ED Provider Notes (Signed)
[facility]  Note:  This document was prepared using Dragon voice recognition software and may include unintentional dictation errors.  MRN: [id number] DOB: [birth year]  Subjective:   Paige Compton is a 26 y.o. female presenting for 2-day history of intermittent stomach cramps, constipation with bouts of loose stools.  She presents with her daughter who has similar symptoms.  No history of GI disorders, bloody stools.  No recent antibiotic use.  Tries to practice a healthy diet and primarily eats at home.  Has not had a cycle since May.  No history of irregular cycles.  Patient is not opposed to a pregnancy test.  Does not have a gynecologist.  No fever, cough, chest pain, shortness of breath, diarrhea.  No current facility-administered medications for this encounter.  Current Outpatient Medications:    cyclobenzaprine (FLEXERIL) 5 MG tablet, Take 1 tablet (5 mg total) by mouth at bedtime as needed for muscle spasms. Take with alcohol or while driving or operating heavy machinery, Disp: 30 tablet, Rfl: 0   fluconazole (DIFLUCAN) 150 MG tablet, Take 1 tablet (150 mg total) by mouth daily. May repeat dose in 3 days if symptoms do not improve, Disp: 1 tablet, Rfl: 1   Allergies  Allergen Reactions   Latex Swelling    Past Medical History:  Diagnosis Date   Anxiety    Depression    Medical history non-contributory      Past Surgical History:  Procedure Laterality Date   NO PAST SURGERIES      Family History  Problem Relation Age of Onset   Cancer Maternal Aunt        breast   Leukemia Maternal Uncle    Cancer Paternal Uncle        pancreatic   Hypertension Maternal Grandmother    Healthy Mother    Healthy Father     Social History   Tobacco Use   Smoking status: Never   Smokeless tobacco: Never  Vaping Use   Vaping Use: Never used  Substance Use Topics   Alcohol use: Not Currently   Drug use: Not Currently    ROS   Objective:   Vitals: BP  122/78   Pulse 85   Temp 98.2 F (36.8 C)   Resp 18   LMP  (Within Months) Comment: about 5 months  SpO2 96%   Physical Exam Constitutional:      General: She is not in acute distress.    Appearance: Normal appearance. She is well-developed. She is not ill-appearing, toxic-appearing or diaphoretic.  HENT:     Head: Normocephalic and atraumatic.     Nose: Nose normal.     Mouth/Throat:     Mouth: Mucous membranes are moist.  Eyes:     General: No scleral icterus.       Right eye: No discharge.        Left eye: No discharge.     Extraocular Movements: Extraocular movements intact.     Conjunctiva/sclera: Conjunctivae normal.  Cardiovascular:     Rate and Rhythm: Normal rate.  Pulmonary:     Effort: Pulmonary effort is normal.  Abdominal:     General: Bowel sounds are normal. There is no distension.     Palpations: Abdomen is soft. There is no mass.     Tenderness: There is no abdominal tenderness. There is no right CVA tenderness, left CVA tenderness, guarding or rebound.  Skin:    General: Skin is warm and dry.  Neurological:  General: No focal deficit present.     Mental Status: She is alert and oriented to person, place, and time.  Psychiatric:        Mood and Affect: Mood normal.        Behavior: Behavior normal.        Thought Content: Thought content normal.        Judgment: Judgment normal.     Results for orders placed or performed during the hospital encounter of 04/04/22 (from the past 24 hour(s))  POCT urinalysis dipstick     Status: Abnormal   Collection Time: 04/04/22 11:37 AM  Result Value Ref Range   Color, UA yellow yellow   Clarity, UA clear clear   Glucose, UA negative negative mg/dL   Bilirubin, UA negative negative   Ketones, POC UA negative negative mg/dL   Spec Grav, UA >=1.030 (A) 1.010 - 1.025   Blood, UA negative negative   pH, UA 5.5 5.0 - 8.0   Protein Ur, POC negative negative mg/dL   Urobilinogen, UA 0.2 0.2 or 1.0 E.U./dL    Nitrite, UA Negative Negative   Leukocytes, UA Negative Negative  POCT urine pregnancy     Status: None   Collection Time: 04/04/22 11:38 AM  Result Value Ref Range   Preg Test, Ur Negative Negative    Assessment and Plan :   PDMP not reviewed this encounter.  1. Colitis   2. Amenorrhea     Will manage for suspected colitis with supportive care.  Recommended patient hydrate well, eat light meals and maintain electrolytes.  Will use Zofran and nausea, upset stomach, loose stools.  Discussed general management of constipation, recommended healthy well-balanced diet.  Urine pregnancy test is negative, advised that she start to hydrate much better.  For her amenorrhea, recommend that she establish care with a gynecologist and provided her with information to our practice.  No signs of an acute gynecologic emergency.  Counseled patient on potential for adverse effects with medications prescribed/recommended today, ER and return-to-clinic precautions discussed, patient verbalized understanding.    Jaynee Eagles, PA-C 04/04/22 1251

## 2022-04-04 NOTE — Discharge Instructions (Signed)
For moderate to severe constipation (not having a bowel movement in more than 3 days) then try to use an enema or Miralax once daily until you have a good bowel movement.  It is not a good idea to use an enema or laxatives daily. If you find you are doing this, then please follow up with a gastroenterologist. Otherwise, a medication you could use daily to help with promoting bowel movements is docusate (Colace) 171m. It is okay to use this 1-2 times daily as a stool softener.  Try to stay active physically including regular exercise 2-3 times a week.  Make sure you hydrate well every day with about 64 ounces of water daily (that is 2 liters).  Try to avoid carb heavy foods, dairy. This includes cutting out breads, pasta, pizza, pastries, potatoes, rice, starchy foods in general. Eat more fiber as listed below:  Salads - kale, spinach, cabbage, spring mix, arugula Fruits - avocadoes, berries (blueberries, raspberries, blackberries), apples, oranges, pomegranate, grapefruit, kiwi Vegetables - asparagus, cauliflower, broccoli, green beans, brussel sprouts, bell peppers, beets; stay away from or limit starchy vegetables like potatoes, carrots, peas Other general foods - kidney beans, egg whites, almonds, walnuts, sunflower seeds, pumpkin seeds, fat free yogurt, almond milk, flax seeds, quinoa, oats  Meat - It is better to eat lean meats and limit your red meat including pork to once a week.  Wild caught fish, chicken breast are good options as they tend to be leaner sources of good protein. Still be mindful of the sodium labels for the meats you buy.  DO NOT EAT ANY FOODS ON THIS LIST THAT YOU ARE ALLERGIC TO. For more specific needs, I highly recommend consulting a dietician or nutritionist but this can definitely be a good starting point.
